# Patient Record
Sex: Male | Born: 1960 | Race: White | Hispanic: No | Marital: Married | Smoking: Current every day smoker
Health system: Southern US, Community
[De-identification: ages and names within clinical notes are randomized; demographics above are authoritative.]

## PROBLEM LIST (undated history)

## (undated) DIAGNOSIS — I10 Essential (primary) hypertension: Secondary | ICD-10-CM

## (undated) DIAGNOSIS — M5136 Other intervertebral disc degeneration, lumbar region: Secondary | ICD-10-CM

## (undated) DIAGNOSIS — I219 Acute myocardial infarction, unspecified: Secondary | ICD-10-CM

## (undated) DIAGNOSIS — M549 Dorsalgia, unspecified: Secondary | ICD-10-CM

## (undated) DIAGNOSIS — M51369 Other intervertebral disc degeneration, lumbar region without mention of lumbar back pain or lower extremity pain: Secondary | ICD-10-CM

## (undated) DIAGNOSIS — M5126 Other intervertebral disc displacement, lumbar region: Secondary | ICD-10-CM

## (undated) HISTORY — PX: ORCHIECTOMY: SHX2116

## (undated) HISTORY — PX: KNEE SURGERY: SHX244

## (undated) HISTORY — PX: CORONARY ANGIOPLASTY WITH STENT PLACEMENT: SHX49

## (undated) HISTORY — PX: ARM WOUND REPAIR / CLOSURE: SUR1141

---

## 2007-12-23 ENCOUNTER — Emergency Department (HOSPITAL_COMMUNITY): Admission: EM | Admit: 2007-12-23 | Discharge: 2007-12-24 | Payer: Self-pay | Admitting: Emergency Medicine

## 2008-02-07 ENCOUNTER — Emergency Department (HOSPITAL_COMMUNITY): Admission: EM | Admit: 2008-02-07 | Discharge: 2008-02-07 | Payer: Self-pay | Admitting: Family Medicine

## 2009-03-15 ENCOUNTER — Emergency Department (HOSPITAL_BASED_OUTPATIENT_CLINIC_OR_DEPARTMENT_OTHER): Admission: EM | Admit: 2009-03-15 | Discharge: 2009-03-15 | Payer: Self-pay | Admitting: Emergency Medicine

## 2010-01-13 ENCOUNTER — Emergency Department (HOSPITAL_COMMUNITY): Admission: EM | Admit: 2010-01-13 | Discharge: 2010-01-13 | Payer: Self-pay | Admitting: Emergency Medicine

## 2011-06-05 LAB — CBC
HCT: 46.9
Hemoglobin: 16.2
MCHC: 34.5
MCV: 86.6
Platelets: 286
RDW: 13.3

## 2011-06-05 LAB — DIFFERENTIAL
Basophils Absolute: 0
Basophils Relative: 0
Eosinophils Absolute: 0
Eosinophils Relative: 0
Monocytes Absolute: 1.4 — ABNORMAL HIGH

## 2011-12-22 ENCOUNTER — Encounter (HOSPITAL_BASED_OUTPATIENT_CLINIC_OR_DEPARTMENT_OTHER): Payer: Self-pay | Admitting: Emergency Medicine

## 2011-12-22 ENCOUNTER — Emergency Department (INDEPENDENT_AMBULATORY_CARE_PROVIDER_SITE_OTHER): Payer: Self-pay

## 2011-12-22 ENCOUNTER — Emergency Department (HOSPITAL_BASED_OUTPATIENT_CLINIC_OR_DEPARTMENT_OTHER)
Admission: EM | Admit: 2011-12-22 | Discharge: 2011-12-22 | Disposition: A | Discharge status: Home / Self Care | Payer: Self-pay | Attending: Emergency Medicine | Admitting: Emergency Medicine

## 2011-12-22 DIAGNOSIS — M545 Low back pain: Secondary | ICD-10-CM

## 2011-12-22 DIAGNOSIS — M549 Dorsalgia, unspecified: Secondary | ICD-10-CM | POA: Insufficient documentation

## 2011-12-22 DIAGNOSIS — M79609 Pain in unspecified limb: Secondary | ICD-10-CM

## 2011-12-22 DIAGNOSIS — F17200 Nicotine dependence, unspecified, uncomplicated: Secondary | ICD-10-CM | POA: Insufficient documentation

## 2011-12-22 DIAGNOSIS — X500XXA Overexertion from strenuous movement or load, initial encounter: Secondary | ICD-10-CM

## 2011-12-22 HISTORY — DX: Other intervertebral disc degeneration, lumbar region: M51.36

## 2011-12-22 HISTORY — DX: Other intervertebral disc displacement, lumbar region: M51.26

## 2011-12-22 HISTORY — DX: Other intervertebral disc degeneration, lumbar region without mention of lumbar back pain or lower extremity pain: M51.369

## 2011-12-22 HISTORY — DX: Dorsalgia, unspecified: M54.9

## 2011-12-22 MED ORDER — CYCLOBENZAPRINE HCL 5 MG PO TABS: 5.0000 mg | ORAL_TABLET | Freq: Two times a day (BID) | ORAL | Status: AC | PRN

## 2011-12-22 MED ORDER — OXYCODONE-ACETAMINOPHEN 5-325 MG PO TABS: 2.0000 | ORAL_TABLET | ORAL | Status: AC | PRN

## 2011-12-22 MED ORDER — CYCLOBENZAPRINE HCL 10 MG PO TABS
5.0000 mg | ORAL_TABLET | Freq: Once | ORAL | Status: AC
  Administered 2011-12-22: 5 mg via ORAL
  Filled 2011-12-22: qty 1

## 2011-12-22 MED ORDER — OXYCODONE-ACETAMINOPHEN 5-325 MG PO TABS
1.0000 | ORAL_TABLET | Freq: Once | ORAL | Status: AC
  Administered 2011-12-22: 1 via ORAL
  Filled 2011-12-22: qty 1

## 2011-12-22 MED ORDER — KETOROLAC TROMETHAMINE 60 MG/2ML IM SOLN
60.0000 mg | Freq: Once | INTRAMUSCULAR | Status: AC
  Administered 2011-12-22: 60 mg via INTRAMUSCULAR
  Filled 2011-12-22: qty 2

## 2011-12-22 NOTE — ED Provider Notes (Signed)
History/physical exam/procedure(s) were performed by non-physician practitioner and as supervising physician I was immediately available for consultation/collaboration. I have reviewed all notes and am in agreement with care and plan.   Shanee Batch S Izaya Netherton, MD 12/22/11 1455 

## 2011-12-22 NOTE — ED Notes (Signed)
Pt states he fell over a log yesterday.  C/o lower back pain radiating down right leg with some numbness and tingling.  Pt states he cannot lift right leg without severe pain.  Pt also c/o inability to stand on right leg.  No difficulty with elimination.

## 2011-12-22 NOTE — Discharge Instructions (Signed)
Back Exercises   Back exercises help treat and prevent back injuries. The goal of back exercises is to increase the strength of your abdominal and back muscles and the flexibility of your back. These exercises should be started when you no longer have back pain. Back exercises include:   Pelvic Tilt. Lie on your back with your knees bent. Tilt your pelvis until the lower part of your back is against the floor. Hold this position 5 to 10 sec and repeat 5 to 10 times.   Knee to Chest. Pull first 1 knee up against your chest and hold for 20 to 30 seconds, repeat this with the other knee, and then both knees. This may be done with the other leg straight or bent, whichever feels better.   Sit-Ups or Curl-Ups. Bend your knees 90 degrees. Start with tilting your pelvis, and do a partial, slow sit-up, lifting your trunk only 30 to 45 degrees off the floor. Take at least 2 to 3 seconds for each sit-up. Do not do sit-ups with your knees out straight. If partial sit-ups are difficult, simply do the above but with only tightening your abdominal muscles and holding it as directed.   Hip-Lift. Lie on your back with your knees flexed 90 degrees. Push down with your feet and shoulders as you raise your hips a couple inches off the floor; hold for 10 seconds, repeat 5 to 10 times.   Back arches. Lie on your stomach, propping yourself up on bent elbows. Slowly press on your hands, causing an arch in your low back. Repeat 3 to 5 times. Any initial stiffness and discomfort should lessen with repetition over time.   Shoulder-Lifts. Lie face down with arms beside your body. Keep hips and torso pressed to floor as you slowly lift your head and shoulders off the floor.   Do not overdo your exercises, especially in the beginning. Exercises may cause you some mild back discomfort which lasts for a few minutes; however, if the pain is more severe, or lasts for more than 15 minutes, do not continue exercises until you see your caregiver.  Improvement with exercise therapy for back problems is slow.   See your caregivers for assistance with developing a proper back exercise program.   Document Released: 10/03/2004 Document Revised: 08/15/2011 Document Reviewed: 08/26/2005   ExitCare® Patient Information ©2012 [employer].     Back Pain, Adult   Low back pain is very common. About 1 in 5 people have back pain. The cause of low back pain is rarely dangerous. The pain often gets better over time. About half of people with a sudden onset of back pain feel better in just 2 weeks. About 8 in 10 people feel better by 6 weeks.   CAUSES   Some common causes of back pain include:   Strain of the muscles or ligaments supporting the spine.   Wear and tear (degeneration) of the spinal discs.   Arthritis.   Direct injury to the back.   DIAGNOSIS   Most of the time, the direct cause of low back pain is not known. However, back pain can be treated effectively even when the exact cause of the pain is unknown. Answering your caregiver's questions about your overall health and symptoms is one of the most accurate ways to make sure the cause of your pain is not dangerous. If your caregiver needs more information, he or she may order lab work or imaging tests (X-rays or MRIs). However, even   if imaging tests show changes in your back, this usually does not require surgery.   HOME CARE INSTRUCTIONS   For many people, back pain returns. Since low back pain is rarely dangerous, it is often a condition that people can learn to manage on their own.   Remain active. It is stressful on the back to sit or stand in one place. Do not sit, drive, or stand in one place for more than 30 minutes at a time. Take short walks on level surfaces as soon as pain allows. Try to increase the length of time you walk each day.   Do not stay in bed. Resting more than 1 or 2 days can delay your recovery.   Do not avoid exercise or work. Your body is made to move. It is not dangerous to be active,  even though your back may hurt. Your back will likely heal faster if you return to being active before your pain is gone.   Pay attention to your body when you bend and lift. Many people have less discomfort when lifting if they bend their knees, keep the load close to their bodies, and avoid twisting. Often, the most comfortable positions are those that put less stress on your recovering back.   Find a comfortable position to sleep. Use a firm mattress and lie on your side with your knees slightly bent. If you lie on your back, put a pillow under your knees.   Only take over-the-counter or prescription medicines as directed by your caregiver. Over-the-counter medicines to reduce pain and inflammation are often the most helpful. Your caregiver may prescribe muscle relaxant drugs. These medicines help dull your pain so you can more quickly return to your normal activities and healthy exercise.   Put ice on the injured area.   Put ice in a plastic bag.   Place a towel between your skin and the bag.   Leave the ice on for 15 to 20 minutes, 3 to 4 times a day for the first 2 to 3 days. After that, ice and heat may be alternated to reduce pain and spasms.   Ask your caregiver about trying back exercises and gentle massage. This may be of some benefit.   Avoid feeling anxious or stressed. Stress increases muscle tension and can worsen back pain. It is important to recognize when you are anxious or stressed and learn ways to manage it. Exercise is a great option.   SEEK MEDICAL CARE IF:   You have pain that is not relieved with rest or medicine.   You have pain that does not improve in 1 week.   You have new symptoms.   You are generally not feeling well.   SEEK IMMEDIATE MEDICAL CARE IF:   You have pain that radiates from your back into your legs.   You develop new bowel or bladder control problems.   You have unusual weakness or numbness in your arms or legs.   You develop nausea or vomiting.   You develop abdominal  pain.   You feel faint.   Document Released: 08/26/2005 Document Revised: 08/15/2011 Document Reviewed: 01/14/2011   ExitCare® Patient Information ©2012 [employer].

## 2011-12-22 NOTE — ED Provider Notes (Signed)
History     CSN: [id number]  Arrival date & time 12/22/11  1147   First MD Initiated Contact with Patient 12/22/11 1218      Chief Complaint  Patient presents with  . Back Pain  . Back Injury    (Consider location/radiation/quality/duration/timing/severity/associated sxs/prior treatment) HPI Comments: Pt states that he tripped over some wood yesterday and no has bad lower back pain that radiates down his right leg:pt states that putting wt on the right causes pain:pt denies bowel or bladder incontinence;pt states that he has had back problems years ago  Patient is a 51 y.o. male presenting with back pain. The history is provided by the patient. No language interpreter was used.  Back Pain  This is a new problem. The current episode started yesterday. The problem occurs constantly. The problem has not changed since onset.The pain is associated with falling. The pain is present in the lumbar spine. The pain radiates to the right thigh. The pain is severe. The symptoms are aggravated by bending and twisting. The pain is the same all the time. Associated symptoms include leg pain and tingling. Pertinent negatives include no fever, no numbness, no bowel incontinence, no perianal numbness, no bladder incontinence and no weakness. He has tried NSAIDs for the symptoms. The treatment provided mild relief.    Past Medical History  Diagnosis Date  . Bulging lumbar disc   . Back pain     Past Surgical History  Procedure Date  . Knee surgery   . Arm wound repair / closure     History reviewed. No pertinent family history.  History  Substance Use Topics  . Smoking status: Current Everyday Smoker -- 1.0 packs/day    Types: Cigarettes  . Smokeless tobacco: Not on file  . Alcohol Use: No      Review of Systems  Constitutional: Negative for fever.  Respiratory: Negative.   Cardiovascular: Negative.   Gastrointestinal: Negative for bowel incontinence.  Genitourinary: Negative for  bladder incontinence.  Musculoskeletal: Positive for back pain.  Neurological: Positive for tingling. Negative for weakness and numbness.    Allergies  Penicillins  Home Medications  No current outpatient prescriptions on file.  BP 125/95  Pulse 104  Temp(Src) 98.3 F (36.8 C) (Oral)  Resp 16  Ht 5\' 3"  (1.6 m)  Wt 110 lb (49.896 kg)  BMI 19.49 kg/m2  SpO2 96%  Physical Exam  Nursing note and vitals reviewed. Constitutional: He is oriented to person, place, and time. He appears well-developed and well-nourished.  HENT:  Head: Normocephalic and atraumatic.  Eyes: Conjunctivae and EOM are normal.  Cardiovascular: Normal rate and regular rhythm.   Pulmonary/Chest: Effort normal and breath sounds normal.  Musculoskeletal: Normal range of motion.       Cervical back: Normal.       Thoracic back: Normal.       Lumbar back: He exhibits tenderness and bony tenderness. He exhibits normal range of motion, no swelling and no edema.  Neurological: He is alert and oriented to person, place, and time.  Skin: Skin is warm.  Psychiatric: He has a normal mood and affect.    ED Course  Procedures (including critical care time)  Labs Reviewed - No data to display Dg Lumbar Spine Complete  12/22/2011  *RADIOLOGY REPORT*  Clinical Data: Low back pain.  Injury while chopping wood.  The pain is worse on the right and extends into the right lower extremity.  LUMBAR SPINE - COMPLETE 4+ VIEW  Comparison:  None.  Findings: Five non-rib bearing lumbar type vertebral bodies are present.  The vertebral body heights and alignment are maintained. There is straightening of the normal lumbar lordosis.  Mild loss of disc height is present at L5-S1.  The disc levels are otherwise preserved.  IMPRESSION:  1.  No acute fracture or traumatic subluxation. 2.  Mild straightening of the normal lumbar lordosis.  This is nonspecific, but can be seen in the setting of acute muscle strain or ongoing pain.  Original  Report Authenticated By: Jamesetta Orleans. MATTERN, M.D.     1. Back pain       MDM  Pt is feeling better at this time:pt is able to do a straight leg raise and ambulated without any problem:will treat symptomatically and refer to ortho        Teressa Lower, NP 12/22/11 1418

## 2012-04-30 ENCOUNTER — Emergency Department (HOSPITAL_BASED_OUTPATIENT_CLINIC_OR_DEPARTMENT_OTHER): Payer: Medicaid Other

## 2012-04-30 ENCOUNTER — Encounter (HOSPITAL_BASED_OUTPATIENT_CLINIC_OR_DEPARTMENT_OTHER): Payer: Self-pay

## 2012-04-30 ENCOUNTER — Emergency Department (HOSPITAL_BASED_OUTPATIENT_CLINIC_OR_DEPARTMENT_OTHER)
Admission: EM | Admit: 2012-04-30 | Discharge: 2012-04-30 | Disposition: A | Discharge status: Home / Self Care | Payer: Medicaid Other | Attending: Emergency Medicine | Admitting: Emergency Medicine

## 2012-04-30 DIAGNOSIS — N509 Disorder of male genital organs, unspecified: Secondary | ICD-10-CM | POA: Insufficient documentation

## 2012-04-30 DIAGNOSIS — N50819 Testicular pain, unspecified: Secondary | ICD-10-CM

## 2012-04-30 DIAGNOSIS — F17200 Nicotine dependence, unspecified, uncomplicated: Secondary | ICD-10-CM | POA: Insufficient documentation

## 2012-04-30 LAB — URINALYSIS, ROUTINE W REFLEX MICROSCOPIC
Bilirubin Urine: NEGATIVE
Ketones, ur: NEGATIVE mg/dL
Leukocytes, UA: NEGATIVE
Nitrite: NEGATIVE
Protein, ur: NEGATIVE mg/dL
Urobilinogen, UA: 0.2 mg/dL (ref 0.0–1.0)
pH: 7 (ref 5.0–8.0)

## 2012-04-30 MED ORDER — OXYCODONE-ACETAMINOPHEN 5-325 MG PO TABS
2.0000 | ORAL_TABLET | Freq: Once | ORAL | Status: AC
  Administered 2012-04-30: 2 via ORAL
  Filled 2012-04-30 (×2): qty 2

## 2012-04-30 MED ORDER — OXYCODONE-ACETAMINOPHEN 5-325 MG PO TABS: 2.0000 | ORAL_TABLET | ORAL | Status: AC | PRN

## 2012-04-30 NOTE — ED Provider Notes (Signed)
Medical screening examination/treatment/procedure(s) were performed by non-physician practitioner and as supervising physician I was immediately available for consultation/collaboration.   Mekhi Lascola B. Bernette Mayers, MD 04/30/12 1506

## 2012-04-30 NOTE — ED Notes (Addendum)
C.o right testicular pain and "hard area" x 1 year-worse x 3 months-pt reports he was seen at [facility] last week-Urology appt 9/4-was given pain med and abx x 2

## 2012-04-30 NOTE — ED Provider Notes (Signed)
Medical screening examination/treatment/procedure(s) were performed by non-physician practitioner and as supervising physician I was immediately available for consultation/collaboration.   Ylonda Storr B. Bernette Mayers, MD 04/30/12 1513

## 2012-04-30 NOTE — ED Provider Notes (Addendum)
History     CSN: [id number]  Arrival date & time 04/30/12  1140   None     No chief complaint on file.   (Consider location/radiation/quality/duration/timing/severity/associated sxs/prior treatment) Patient is a 51 y.o. male presenting with testicular pain. The history is provided by the patient. No language interpreter was used.  Testicle Pain This is a chronic problem. The current episode started more than 1 year ago. The problem occurs constantly. The problem has been gradually worsening. Associated symptoms comments: Pain right testicle. Nothing aggravates the symptoms. He has tried nothing for the symptoms. The treatment provided moderate relief.  Pt complains of a mass to right testicle for several months,   Pt reports he has had pain for the last year  Past Medical History  Diagnosis Date  . Bulging lumbar disc   . Back pain     Past Surgical History  Procedure Date  . Knee surgery   . Arm wound repair / closure     No family history on file.  History  Substance Use Topics  . Smoking status: Current Everyday Smoker -- 1.0 packs/day    Types: Cigarettes  . Smokeless tobacco: Not on file  . Alcohol Use: No      Review of Systems  Genitourinary: Positive for testicular pain.  All other systems reviewed and are negative.    Allergies  Penicillins  Home Medications  No current outpatient prescriptions on file.  BP 148/75  Pulse 76  Temp 97.9 F (36.6 C) (Oral)  Resp 18  Ht 5\' 3"  (1.6 m)  Wt 103 lb (46.72 kg)  BMI 18.25 kg/m2  SpO2 99%  Physical Exam  Nursing note and vitals reviewed. Constitutional: He is oriented to person, place, and time.  HENT:  Head: Normocephalic and atraumatic.  Right Ear: External ear normal.  Eyes: Pupils are equal, round, and reactive to light.  Neck: Normal range of motion. Neck supple.  Cardiovascular: Normal rate and normal heart sounds.   Pulmonary/Chest: Effort normal.  Abdominal: Soft.  Genitourinary:   Swollen tender area right   Musculoskeletal: Normal range of motion.  Neurological: He is alert and oriented to person, place, and time. He has normal reflexes.  Skin: Skin is warm.  Psychiatric: He has a normal mood and affect.    ED Course  Procedures (including critical care time)  Labs Reviewed - No data to display No results found.   1. Testicle pain       MDM  Pt counseled on results,  Pt given percocet,  I advised continue doxycycline.  I spoke to [facility] NP and advised of results and need for follow up        Elson Areas, [place] 04/30/12 [address] [place], [place] 04/30/12 [phone]

## 2012-05-26 ENCOUNTER — Emergency Department (HOSPITAL_BASED_OUTPATIENT_CLINIC_OR_DEPARTMENT_OTHER)
Admission: EM | Admit: 2012-05-26 | Discharge: 2012-05-26 | Disposition: A | Discharge status: Home / Self Care | Payer: Medicaid Other | Attending: Emergency Medicine | Admitting: Emergency Medicine

## 2012-05-26 ENCOUNTER — Encounter (HOSPITAL_BASED_OUTPATIENT_CLINIC_OR_DEPARTMENT_OTHER): Payer: Self-pay | Admitting: *Deleted

## 2012-05-26 DIAGNOSIS — F17200 Nicotine dependence, unspecified, uncomplicated: Secondary | ICD-10-CM | POA: Insufficient documentation

## 2012-05-26 DIAGNOSIS — N509 Disorder of male genital organs, unspecified: Secondary | ICD-10-CM | POA: Insufficient documentation

## 2012-05-26 DIAGNOSIS — N50819 Testicular pain, unspecified: Secondary | ICD-10-CM

## 2012-05-26 LAB — URINALYSIS, ROUTINE W REFLEX MICROSCOPIC
Bilirubin Urine: NEGATIVE
Hgb urine dipstick: NEGATIVE
Ketones, ur: NEGATIVE mg/dL
Specific Gravity, Urine: 1.004 — ABNORMAL LOW (ref 1.005–1.030)
Urobilinogen, UA: 0.2 mg/dL (ref 0.0–1.0)

## 2012-05-26 MED ORDER — OXYCODONE-ACETAMINOPHEN 5-325 MG PO TABS
1.0000 | ORAL_TABLET | Freq: Once | ORAL | Status: AC
  Administered 2012-05-26: 1 via ORAL
  Filled 2012-05-26 (×2): qty 1

## 2012-05-26 MED ORDER — NAPROXEN 500 MG PO TABS: 500.0000 mg | ORAL_TABLET | Freq: Two times a day (BID) | ORAL | Status: DC

## 2012-05-26 MED ORDER — OXYCODONE-ACETAMINOPHEN 5-325 MG PO TABS: 1.0000 | ORAL_TABLET | ORAL | Status: DC | PRN

## 2012-05-26 NOTE — ED Provider Notes (Signed)
History     CSN: [id number]  Arrival date & time 05/26/12  1639   First MD Initiated Contact with Patient 05/26/12 1654      Chief Complaint  Patient presents with  . Groin Pain     Patient is a 51 y.o. male presenting with groin pain. The history is provided by the patient.  Groin Pain The current episode started more than 1 week ago (Patient has been having these symptoms since prior to August 22). The problem occurs constantly. The problem has not changed since onset.Pertinent negatives include no chest pain, no abdominal pain, no headaches and no shortness of breath. Exacerbated by: Palpation and walking. Treatments tried: The patient was seen in the emergency room on August 22. 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 Patient was seen by urologist as well.   they tried him on a course of doxycycline as well as Cipro and azithromycin. His symptoms have not gotten much better. The patient has plans to see the urologist tomorrow but when he tried to call today to get some pain medications he was benign. .the symptoms have persisted. He denies any fever, vomiting or diarrhea. He denies any abdominal pain. He denies any penile discharge or hematuria  Past Medical History  Diagnosis Date  . Bulging lumbar disc   . Back pain     Past Surgical History  Procedure Date  . Knee surgery   . Arm wound repair / closure     No family history on file.  History  Substance Use Topics  . Smoking status: Current Every Day Smoker -- 1.0 packs/day    Types: Cigarettes  . Smokeless tobacco: Not on file  . Alcohol Use: No      Review of Systems  Respiratory: Negative for shortness of breath.   Cardiovascular: Negative for chest pain.  Gastrointestinal: Negative for abdominal pain.  Neurological: Negative for headaches.  All other systems reviewed and are negative.    Allergies  Penicillins  Home Medications   Current Outpatient  Rx  Name Route Sig Dispense Refill  . AZITHROMYCIN 250 MG PO TABS Oral Take 250 mg by mouth daily.    Marland Kitchen CIPROFLOXACIN HCL 250 MG PO TABS Oral Take 250 mg by mouth 2 (two) times daily.    Marland Kitchen DOXYCYCLINE HYCLATE 100 MG PO TBEC Oral Take 100 mg by mouth 2 (two) times daily.    . OXYCODONE-ACETAMINOPHEN 5-325 MG PO TABS Oral Take 1 tablet by mouth every 4 (four) hours as needed. For pain.    Marland Kitchen TRAMADOL HCL 50 MG PO TABS Oral Take 50 mg by mouth every 6 (six) hours as needed. For pain.      BP 153/128  Pulse 99  Temp 99 F (37.2 C) (Oral)  Resp 24  SpO2 100%  Physical Exam  Nursing note and vitals reviewed. Constitutional: He appears well-developed and well-nourished. No distress.  HENT:  Head: Normocephalic and atraumatic.  Right Ear: External ear normal.  Left Ear: External ear normal.  Eyes: Conjunctivae normal are normal. Right eye exhibits no discharge. Left eye exhibits no discharge. No scleral icterus.  Neck: Neck supple. No tracheal deviation present.  Cardiovascular: Normal rate, regular rhythm and intact distal pulses.   Pulmonary/Chest: Effort normal and breath sounds normal. No stridor. No respiratory distress. He has no wheezes. He has no rales.  Abdominal: Soft. Bowel sounds are normal. He exhibits no distension. There is no tenderness. There is no rebound  and no guarding. Hernia confirmed negative in the right inguinal area and confirmed negative in the left inguinal area.  Genitourinary: Penis normal. Right testis shows tenderness. Right testis shows no mass and no swelling. Right testis is descended. Cremasteric reflex is not absent on the right side. Left testis shows no mass, no swelling and no tenderness. Left testis is descended. Cremasteric reflex is not absent on the left side. No penile erythema or penile tenderness. No discharge found.  Musculoskeletal: He exhibits no edema and no tenderness.  Lymphadenopathy:       Right: No inguinal adenopathy present.       Left:  No inguinal adenopathy present.  Neurological: He is alert. He has normal strength. No sensory deficit. Cranial nerve deficit:  no gross defecits noted. He exhibits normal muscle tone. He displays no seizure activity. Coordination normal.  Skin: Skin is warm and dry. No rash noted.  Psychiatric: He has a normal mood and affect.    ED Course  Procedures (including critical care time)  Labs Reviewed  URINALYSIS, ROUTINE W REFLEX MICROSCOPIC - Abnormal; Notable for the following:    Specific Gravity, Urine 1.004 (*)     All other components within normal limits   No results found.   1. Testicular pain       MDM  PT had an outpatient [place] when these symptoms started.  No sign of mass or torsion.  Pt has follow up with urology tomorrow.  No sign of uti.  Already on abx for epididymitis.  At this time there does not appear to be any evidence of an acute emergency medical condition and the patient appears stable for discharge with appropriate outpatient follow up.         Celene Kras, MD 05/26/12 [phone]

## 2012-05-26 NOTE — ED Notes (Signed)
Pain in his right groin. Was treated for same 8/22 with a z-pak. He saw a urologist at that time but has seen no improvement with pain.

## 2012-09-25 ENCOUNTER — Encounter (HOSPITAL_BASED_OUTPATIENT_CLINIC_OR_DEPARTMENT_OTHER): Payer: Self-pay | Admitting: Emergency Medicine

## 2012-09-25 ENCOUNTER — Emergency Department (HOSPITAL_BASED_OUTPATIENT_CLINIC_OR_DEPARTMENT_OTHER): Payer: Medicaid Other

## 2012-09-25 ENCOUNTER — Emergency Department (HOSPITAL_BASED_OUTPATIENT_CLINIC_OR_DEPARTMENT_OTHER)
Admission: EM | Admit: 2012-09-25 | Discharge: 2012-09-25 | Disposition: A | Discharge status: Home / Self Care | Payer: Medicaid Other | Attending: Emergency Medicine | Admitting: Emergency Medicine

## 2012-09-25 DIAGNOSIS — X500XXA Overexertion from strenuous movement or load, initial encounter: Secondary | ICD-10-CM | POA: Insufficient documentation

## 2012-09-25 DIAGNOSIS — Y9389 Activity, other specified: Secondary | ICD-10-CM | POA: Insufficient documentation

## 2012-09-25 DIAGNOSIS — F17200 Nicotine dependence, unspecified, uncomplicated: Secondary | ICD-10-CM | POA: Insufficient documentation

## 2012-09-25 DIAGNOSIS — S99929A Unspecified injury of unspecified foot, initial encounter: Secondary | ICD-10-CM | POA: Insufficient documentation

## 2012-09-25 DIAGNOSIS — Y929 Unspecified place or not applicable: Secondary | ICD-10-CM | POA: Insufficient documentation

## 2012-09-25 DIAGNOSIS — M25569 Pain in unspecified knee: Secondary | ICD-10-CM

## 2012-09-25 DIAGNOSIS — S8990XA Unspecified injury of unspecified lower leg, initial encounter: Secondary | ICD-10-CM | POA: Insufficient documentation

## 2012-09-25 DIAGNOSIS — Z8739 Personal history of other diseases of the musculoskeletal system and connective tissue: Secondary | ICD-10-CM | POA: Insufficient documentation

## 2012-09-25 MED ORDER — OXYCODONE-ACETAMINOPHEN 5-325 MG PO TABS: 1.0000 | ORAL_TABLET | Freq: Four times a day (QID) | ORAL | Status: DC | PRN

## 2012-09-25 MED ORDER — NAPROXEN 500 MG PO TABS: 500.0000 mg | ORAL_TABLET | Freq: Two times a day (BID) | ORAL | Status: DC

## 2012-09-25 NOTE — ED Notes (Signed)
Patient report he stepped off a ladder at 11:30 and felt something "pop on the right side of my right knee." No swelling or redness on assessment. CMS intact.

## 2012-09-25 NOTE — ED Provider Notes (Signed)
History     CSN: [id number]  Arrival date & time 09/25/12  1634   First MD Initiated Contact with Patient 09/25/12 1714      Chief Complaint  Patient presents with  . Knee Injury    (Consider location/radiation/quality/duration/timing/severity/associated sxs/prior treatment) HPI Comments: Patient presents with complaint of right knee pain that started acutely today after twisting his knee while stepping off the first rung of a ladder. Patient denies other injuries. Patient states that he felt a pop, similar to when he tore his anterior cruciate ligament in the past. Patient complains of knee pain that is worse with walking, however he is ambulatory. Patient is been taking ibuprofen without relief. He denies numbness, tingling, or weakness of the lower extremity. Patient has an orthopedic doctor in [place]. No back pain. Course is constant.   The history is provided by the patient.    Past Medical History  Diagnosis Date  . Bulging lumbar disc   . Back pain     Past Surgical History  Procedure Date  . Knee surgery   . Arm wound repair / closure     History reviewed. No pertinent family history.  History  Substance Use Topics  . Smoking status: Current Every Day Smoker -- 1.0 packs/day    Types: Cigarettes  . Smokeless tobacco: Not on file  . Alcohol Use: No      Review of Systems  Constitutional: Negative for activity change.  HENT: Negative for neck pain.   Musculoskeletal: Positive for joint swelling, arthralgias and gait problem. Negative for back pain.  Skin: Negative for wound.  Neurological: Negative for weakness and numbness.    Allergies  Penicillins  Home Medications   Current Outpatient Rx  Name  Route  Sig  Dispense  Refill  . MELOXICAM 15 MG PO TABS   Oral   Take 15 mg by mouth daily.         . AZITHROMYCIN 250 MG PO TABS   Oral   Take 250 mg by mouth daily.         Marland Kitchen CIPROFLOXACIN HCL 250 MG PO TABS   Oral   Take 250 mg by mouth  2 (two) times daily.         Marland Kitchen DOXYCYCLINE HYCLATE 100 MG PO TBEC   Oral   Take 100 mg by mouth 2 (two) times daily.         Marland Kitchen NAPROXEN 500 MG PO TABS   Oral   Take 1 tablet (500 mg total) by mouth 2 (two) times daily with a meal. As needed for pain   20 tablet   0   . OXYCODONE-ACETAMINOPHEN 5-325 MG PO TABS   Oral   Take 1 tablet by mouth every 4 (four) hours as needed. For pain.   16 tablet   0   . TRAMADOL HCL 50 MG PO TABS   Oral   Take 50 mg by mouth every 6 (six) hours as needed. For pain.           BP 160/95  Pulse 84  Temp 98 F (36.7 C) (Oral)  Resp 16  SpO2 98%  Physical Exam  Nursing note and vitals reviewed. Constitutional: He appears well-developed and well-nourished.  HENT:  Head: Normocephalic and atraumatic.  Eyes: Conjunctivae normal are normal.  Neck: Normal range of motion. Neck supple.  Cardiovascular: Normal pulses.   Pulses:      Dorsalis pedis pulses are 2+ on the right side, and 2+  on the left side.       Posterior tibial pulses are 2+ on the right side, and 2+ on the left side.  Musculoskeletal: He exhibits edema and tenderness.       Right hip: Normal.       Right knee: He exhibits effusion (mild). He exhibits normal range of motion, no ecchymosis, no deformity, no laceration and no erythema. tenderness found. Medial joint line and lateral joint line tenderness noted. No patellar tendon tenderness noted.       Right ankle: Normal. no tenderness. Achilles tendon normal.  Neurological: He is alert. No sensory deficit.       Motor, sensation, and vascular distal to the injury is fully intact.   Skin: Skin is warm and dry.  Psychiatric: He has a normal mood and affect.    ED Course  Procedures (including critical care time)  Labs Reviewed - No data to display Dg Knee Complete 4 Views Right  09/25/2012  *RADIOLOGY REPORT*  Clinical Data: Knee injury  RIGHT KNEE - COMPLETE 4+ VIEW  Comparison: None.  Findings: Four views of the right  knee submitted.  No acute fracture or subluxation.  Prior surgical changes noted distal femur and proximal tibia.  There is mild spurring of the patella.  Small joint effusion.  IMPRESSION: No acute fracture or subluxation.  Mild spurring of patella.  Small joint effusion.   Original Report Authenticated By: Natasha Mead, M.D.      1. Knee pain     6:00 PM Patient seen and examined. X-ray reviewed. Patient informed of results.   Vital signs reviewed and are as follows: Filed Vitals:   09/25/12 1641  BP: 160/95  Pulse: 84  Temp: 98 F (36.7 C)  Resp: 16   Patient was counseled on RICE protocol and told to rest injury, use ice for no longer than 15 minutes every hour, compress the area, and elevate above the level of their heart as much as possible to reduce swelling.  Questions answered.  Patient verbalized understanding.    Patient provided with knee immobilizer. He has crutches at home which he will use. Patient has an orthopedic doc in [place] who he will see for followup.  Patient counseled on use of narcotic pain medications. Counseled not to combine these medications with others containing tylenol. Urged not to drink alcohol, drive, or perform any other activities that requires focus while taking these medications. The patient verbalizes understanding and agrees with the plan.   MDM  Knee injury -- negative x-ray. Concern for ligamentous injury. Knee immobilizer, crutches with orthopedic followup indicated. Patient given his medication for pain control.        Renne Crigler, [place] 09/25/12 [phone]

## 2012-09-25 NOTE — ED Provider Notes (Signed)
Medical screening examination/treatment/procedure(s) were performed by non-physician practitioner and as supervising physician I was immediately available for consultation/collaboration.  Christopher J. Pollina, MD 09/25/12 1820 

## 2013-11-24 ENCOUNTER — Inpatient Hospital Stay (HOSPITAL_BASED_OUTPATIENT_CLINIC_OR_DEPARTMENT_OTHER)
Admission: EM | Admit: 2013-11-24 | Discharge: 2013-11-27 | DRG: 392 | Disposition: A | Discharge status: Home / Self Care | Payer: Medicaid Other | Attending: Surgery | Admitting: Surgery

## 2013-11-24 ENCOUNTER — Emergency Department (HOSPITAL_BASED_OUTPATIENT_CLINIC_OR_DEPARTMENT_OTHER): Payer: Medicaid Other

## 2013-11-24 ENCOUNTER — Encounter (HOSPITAL_BASED_OUTPATIENT_CLINIC_OR_DEPARTMENT_OTHER): Payer: Self-pay | Admitting: Emergency Medicine

## 2013-11-24 DIAGNOSIS — F17200 Nicotine dependence, unspecified, uncomplicated: Secondary | ICD-10-CM | POA: Diagnosis present

## 2013-11-24 DIAGNOSIS — Z01810 Encounter for preprocedural cardiovascular examination: Secondary | ICD-10-CM

## 2013-11-24 DIAGNOSIS — M519 Unspecified thoracic, thoracolumbar and lumbosacral intervertebral disc disorder: Secondary | ICD-10-CM | POA: Diagnosis present

## 2013-11-24 DIAGNOSIS — F1210 Cannabis abuse, uncomplicated: Secondary | ICD-10-CM | POA: Diagnosis present

## 2013-11-24 DIAGNOSIS — R1031 Right lower quadrant pain: Principal | ICD-10-CM | POA: Diagnosis present

## 2013-11-24 DIAGNOSIS — Z7982 Long term (current) use of aspirin: Secondary | ICD-10-CM

## 2013-11-24 DIAGNOSIS — IMO0002 Reserved for concepts with insufficient information to code with codable children: Secondary | ICD-10-CM

## 2013-11-24 DIAGNOSIS — G8929 Other chronic pain: Secondary | ICD-10-CM | POA: Diagnosis present

## 2013-11-24 DIAGNOSIS — K37 Unspecified appendicitis: Secondary | ICD-10-CM | POA: Diagnosis present

## 2013-11-24 DIAGNOSIS — Z79899 Other long term (current) drug therapy: Secondary | ICD-10-CM

## 2013-11-24 DIAGNOSIS — Z9861 Coronary angioplasty status: Secondary | ICD-10-CM

## 2013-11-24 DIAGNOSIS — Z8249 Family history of ischemic heart disease and other diseases of the circulatory system: Secondary | ICD-10-CM

## 2013-11-24 DIAGNOSIS — I252 Old myocardial infarction: Secondary | ICD-10-CM

## 2013-11-24 DIAGNOSIS — I2510 Atherosclerotic heart disease of native coronary artery without angina pectoris: Secondary | ICD-10-CM | POA: Diagnosis present

## 2013-11-24 DIAGNOSIS — Z9079 Acquired absence of other genital organ(s): Secondary | ICD-10-CM

## 2013-11-24 DIAGNOSIS — I10 Essential (primary) hypertension: Secondary | ICD-10-CM | POA: Diagnosis present

## 2013-11-24 DIAGNOSIS — K219 Gastro-esophageal reflux disease without esophagitis: Secondary | ICD-10-CM | POA: Diagnosis present

## 2013-11-24 DIAGNOSIS — Z823 Family history of stroke: Secondary | ICD-10-CM

## 2013-11-24 HISTORY — DX: Acute myocardial infarction, unspecified: I21.9

## 2013-11-24 HISTORY — DX: Essential (primary) hypertension: I10

## 2013-11-24 LAB — CBC WITH DIFFERENTIAL/PLATELET
Basophils Absolute: 0.1 10*3/uL (ref 0.0–0.1)
Basophils Relative: 1 % (ref 0–1)
EOS ABS: 0.3 10*3/uL (ref 0.0–0.7)
Eosinophils Relative: 3 % (ref 0–5)
HEMATOCRIT: 39.9 % (ref 39.0–52.0)
Hemoglobin: 13.8 g/dL (ref 13.0–17.0)
LYMPHS PCT: 29 % (ref 12–46)
Lymphs Abs: 2.3 10*3/uL (ref 0.7–4.0)
MCH: 31.2 pg (ref 26.0–34.0)
MCHC: 34.6 g/dL (ref 30.0–36.0)
MCV: 90.3 fL (ref 78.0–100.0)
MONO ABS: 1 10*3/uL (ref 0.1–1.0)
Monocytes Relative: 13 % — ABNORMAL HIGH (ref 3–12)
Neutro Abs: 4.3 10*3/uL (ref 1.7–7.7)
Neutrophils Relative %: 54 % (ref 43–77)
Platelets: 243 10*3/uL (ref 150–400)
RBC: 4.42 MIL/uL (ref 4.22–5.81)
RDW: 13.3 % (ref 11.5–15.5)
WBC: 8 10*3/uL (ref 4.0–10.5)

## 2013-11-24 LAB — COMPREHENSIVE METABOLIC PANEL
ALT: 16 U/L (ref 0–53)
AST: 16 U/L (ref 0–37)
Albumin: 4.2 g/dL (ref 3.5–5.2)
Alkaline Phosphatase: 78 U/L (ref 39–117)
BUN: 16 mg/dL (ref 6–23)
CALCIUM: 9.7 mg/dL (ref 8.4–10.5)
CO2: 23 meq/L (ref 19–32)
Chloride: 99 mEq/L (ref 96–112)
Creatinine, Ser: 0.8 mg/dL (ref 0.50–1.35)
GFR calc non Af Amer: >90 mL/min (ref >90)
GLUCOSE: 99 mg/dL (ref 70–99)
Potassium: 4.5 mEq/L (ref 3.7–5.3)
Sodium: 137 mEq/L (ref 137–147)
TOTAL PROTEIN: 7.7 g/dL (ref 6.0–8.3)
Total Bilirubin: 0.2 mg/dL — ABNORMAL LOW (ref 0.3–1.2)

## 2013-11-24 LAB — URINALYSIS, ROUTINE W REFLEX MICROSCOPIC
Bilirubin Urine: NEGATIVE
Glucose, UA: NEGATIVE mg/dL
Hgb urine dipstick: NEGATIVE
KETONES UR: NEGATIVE mg/dL
Leukocytes, UA: NEGATIVE
NITRITE: NEGATIVE
PH: 6.5 (ref 5.0–8.0)
Protein, ur: NEGATIVE mg/dL
SPECIFIC GRAVITY, URINE: 1.008 (ref 1.005–1.030)
UROBILINOGEN UA: 0.2 mg/dL (ref 0.0–1.0)

## 2013-11-24 MED ORDER — ACETAMINOPHEN 650 MG RE SUPP: 650.0000 mg | Freq: Four times a day (QID) | RECTAL | Status: DC | PRN

## 2013-11-24 MED ORDER — SODIUM CHLORIDE 0.9 % IV SOLN
1.0000 g | Freq: Once | INTRAVENOUS | Status: AC
  Administered 2013-11-24: 1 g via INTRAVENOUS
  Filled 2013-11-24: qty 1

## 2013-11-24 MED ORDER — DIPHENHYDRAMINE HCL 50 MG/ML IJ SOLN
12.5000 mg | Freq: Four times a day (QID) | INTRAMUSCULAR | Status: DC | PRN
Start: 2013-11-24 — End: 2013-11-27

## 2013-11-24 MED ORDER — AMLODIPINE BESYLATE 5 MG PO TABS
5.0000 mg | ORAL_TABLET | Freq: Every day | ORAL | Status: DC
  Administered 2013-11-25 – 2013-11-26 (×2): 5 mg via ORAL
  Filled 2013-11-24 (×3): qty 1

## 2013-11-24 MED ORDER — MORPHINE SULFATE 2 MG/ML IJ SOLN
1.0000 mg | INTRAMUSCULAR | Status: DC | PRN
Start: 2013-11-24 — End: 2013-11-27
  Administered 2013-11-24 – 2013-11-25 (×6): 2 mg via INTRAVENOUS
  Administered 2013-11-25: 3 mg via INTRAVENOUS
  Administered 2013-11-25 – 2013-11-26 (×4): 2 mg via INTRAVENOUS
  Administered 2013-11-26: 3 mg via INTRAVENOUS
  Administered 2013-11-26 – 2013-11-27 (×3): 2 mg via INTRAVENOUS
  Filled 2013-11-24 (×3): qty 1
  Filled 2013-11-24: qty 2
  Filled 2013-11-24 (×10): qty 1
  Filled 2013-11-24: qty 2

## 2013-11-24 MED ORDER — OXYCODONE HCL 5 MG PO TABS
5.0000 mg | ORAL_TABLET | ORAL | Status: DC | PRN
  Administered 2013-11-25 – 2013-11-27 (×9): 10 mg via ORAL
  Filled 2013-11-24 (×9): qty 2

## 2013-11-24 MED ORDER — ACETAMINOPHEN 325 MG PO TABS: 650.0000 mg | ORAL_TABLET | Freq: Four times a day (QID) | ORAL | Status: DC | PRN

## 2013-11-24 MED ORDER — CARVEDILOL 25 MG PO TABS: 25.0000 mg | ORAL_TABLET | Freq: Two times a day (BID) | ORAL | Status: DC

## 2013-11-24 MED ORDER — CARVEDILOL 25 MG PO TABS
25.0000 mg | ORAL_TABLET | Freq: Two times a day (BID) | ORAL | Status: DC
  Administered 2013-11-24 – 2013-11-27 (×6): 25 mg via ORAL
  Filled 2013-11-24 (×8): qty 1

## 2013-11-24 MED ORDER — IOHEXOL 300 MG/ML  SOLN
50.0000 mL | Freq: Once | INTRAMUSCULAR | Status: AC | PRN
  Administered 2013-11-24: 50 mL via ORAL

## 2013-11-24 MED ORDER — NICOTINE 21 MG/24HR TD PT24
21.0000 mg | MEDICATED_PATCH | Freq: Once | TRANSDERMAL | Status: AC
  Administered 2013-11-24: 21 mg via TRANSDERMAL
  Filled 2013-11-24: qty 1

## 2013-11-24 MED ORDER — IOHEXOL 300 MG/ML  SOLN
100.0000 mL | Freq: Once | INTRAMUSCULAR | Status: AC | PRN
  Administered 2013-11-24: 100 mL via INTRAVENOUS

## 2013-11-24 MED ORDER — SODIUM CHLORIDE 0.9 % IV SOLN
1.0000 g | INTRAVENOUS | Status: DC
  Administered 2013-11-25 – 2013-11-26 (×2): 1 g via INTRAVENOUS
  Filled 2013-11-24 (×3): qty 1

## 2013-11-24 MED ORDER — ONDANSETRON HCL 4 MG/2ML IJ SOLN
4.0000 mg | Freq: Once | INTRAMUSCULAR | Status: AC
  Administered 2013-11-24: 4 mg via INTRAVENOUS
  Filled 2013-11-24: qty 2

## 2013-11-24 MED ORDER — LISINOPRIL 20 MG PO TABS
20.0000 mg | ORAL_TABLET | Freq: Two times a day (BID) | ORAL | Status: DC
  Administered 2013-11-24 – 2013-11-26 (×5): 20 mg via ORAL
  Filled 2013-11-24 (×8): qty 1

## 2013-11-24 MED ORDER — ONDANSETRON HCL 4 MG/2ML IJ SOLN: 4.0000 mg | Freq: Four times a day (QID) | INTRAMUSCULAR | Status: DC | PRN

## 2013-11-24 MED ORDER — POTASSIUM CHLORIDE IN NACL 20-0.9 MEQ/L-% IV SOLN
INTRAVENOUS | Status: DC
  Administered 2013-11-24 – 2013-11-26 (×5): via INTRAVENOUS
  Filled 2013-11-24 (×8): qty 1000

## 2013-11-24 MED ORDER — MORPHINE SULFATE 4 MG/ML IJ SOLN
4.0000 mg | Freq: Once | INTRAMUSCULAR | Status: AC
  Administered 2013-11-24: 4 mg via INTRAVENOUS
  Filled 2013-11-24: qty 1

## 2013-11-24 MED ORDER — DIPHENHYDRAMINE HCL 12.5 MG/5ML PO ELIX: 12.5000 mg | ORAL_SOLUTION | Freq: Four times a day (QID) | ORAL | Status: DC | PRN

## 2013-11-24 MED ORDER — PANTOPRAZOLE SODIUM 40 MG IV SOLR
40.0000 mg | Freq: Every day | INTRAVENOUS | Status: DC
  Administered 2013-11-24 – 2013-11-25 (×2): 40 mg via INTRAVENOUS
  Filled 2013-11-24 (×3): qty 40

## 2013-11-24 NOTE — Progress Notes (Signed)
Pharmacy - Brief note  Order was received to start heparin per pharmacy for periop-anticoag while off Effient(5 stents placed July 1015). It is unusual to use heparin in this situation so I spoke with Will Marlyne BeardsJennings, the patient needs an appendectomy and Cardiology will be consulting. The last dose of Effient was taken 3/18 AM so he is covered until 3/19 AM. He gave a verbal order to hold off on starting heparin until 3/19 AM or until Cardiology can consult.  Charolotte Ekeom Tauna Macfarlane, PharmD, pager [phone]. 11/24/2013,5:44 PM.

## 2013-11-24 NOTE — ED Provider Notes (Signed)
CSN: [id number]     Arrival date & time 11/24/13  [id number] History   First MD Initiated Contact with Patient 11/24/13 [phone]     Chief Complaint  Patient presents with  . Groin Pain     (Consider location/radiation/quality/duration/timing/severity/associated sxs/prior Treatment) HPI  This is a 53 year old male who presents with right lower quadrant pain. Patient reports onset of symptoms 2 days ago. He reports sharp nonradiating right lower quadrant pain. Currently it is 8/10. It is constant. Patient denies any nausea or vomiting. He reports decreased by mouth intake. Nothing makes the pain better or worse.  Patient denies any fever. Patient reports history of orchiectomy on the right approximately 2 years ago. Patient denies any additional symptoms including chest pain, shortness of breath, constipation, urinary symptoms, urethral discharge.  Past Medical History  Diagnosis Date  . Bulging lumbar disc   . Back pain   . MI (myocardial infarction)   . Hypertension    Past Surgical History  Procedure Laterality Date  . Knee surgery    . Arm wound repair / closure    . Coronary angioplasty with stent placement     No family history on file. History  Substance Use Topics  . Smoking status: Current Every Day Smoker -- 1.00 packs/day    Types: Cigarettes  . Smokeless tobacco: Not on file  . Alcohol Use: No    Review of Systems  Constitutional: Negative.  Negative for fever.  Respiratory: Negative.  Negative for chest tightness and shortness of breath.   Cardiovascular: Negative.  Negative for chest pain.  Gastrointestinal: Positive for abdominal pain. Negative for nausea, vomiting, diarrhea and constipation.  Genitourinary: Negative.  Negative for dysuria, hematuria, discharge, scrotal swelling, penile pain and testicular pain.  Musculoskeletal: Negative for back pain.  Skin: Negative for rash.  Neurological: Negative for headaches.  All other systems reviewed and are  negative.      Allergies  Penicillins  Home Medications   Current Outpatient Rx  Name  Route  Sig  Dispense  Refill  . amLODipine (NORVASC) 5 MG tablet   Oral   Take 5 mg by mouth daily.         Marland Kitchen. aspirin 81 MG tablet   Oral   Take 81 mg by mouth daily.         Marland Kitchen. atorvastatin (LIPITOR) 40 MG tablet   Oral   Take 40 mg by mouth daily.         . carvedilol (COREG) 25 MG tablet   Oral   Take 25 mg by mouth 2 (two) times daily with a meal.         . lisinopril (PRINIVIL,ZESTRIL) 20 MG tablet   Oral   Take 20 mg by mouth 2 (two) times daily.         . pantoprazole (PROTONIX) 20 MG tablet   Oral   Take 20 mg by mouth daily.         . Prasugrel HCl (EFFIENT PO)   Oral   Take by mouth.         Marland Kitchen. azithromycin (ZITHROMAX) 250 MG tablet   Oral   Take 250 mg by mouth daily.         . ciprofloxacin (CIPRO) 250 MG tablet   Oral   Take 250 mg by mouth 2 (two) times daily.         Marland Kitchen. doxycycline (DORYX) 100 MG EC tablet   Oral   Take 100 mg by mouth  2 (two) times daily.         . meloxicam (MOBIC) 15 MG tablet   Oral   Take 15 mg by mouth daily.         . naproxen (NAPROSYN) 500 MG tablet   Oral   Take 1 tablet (500 mg total) by mouth 2 (two) times daily with a meal. As needed for pain   20 tablet   0   . naproxen (NAPROSYN) 500 MG tablet   Oral   Take 1 tablet (500 mg total) by mouth 2 (two) times daily.   20 tablet   0   . oxyCODONE-acetaminophen (PERCOCET) 5-325 MG per tablet   Oral   Take 1 tablet by mouth every 4 (four) hours as needed. For pain.   16 tablet   0   . oxyCODONE-acetaminophen (PERCOCET/ROXICET) 5-325 MG per tablet   Oral   Take 1-2 tablets by mouth every 6 (six) hours as needed for pain.   12 tablet   0   . traMADol (ULTRAM) 50 MG tablet   Oral   Take 50 mg by mouth every 6 (six) hours as needed. For pain.          BP 133/93  Pulse 74  Temp(Src) 98.2 F (36.8 C) (Oral)  Resp 16  Ht 5\' 3"  (1.6 m)  Wt  110 lb (49.896 kg)  BMI 19.49 kg/m2  SpO2 96% Physical Exam  Nursing note and vitals reviewed. Constitutional: He is oriented to person, place, and time. No distress.  Thin, disheveled  HENT:  Head: Normocephalic and atraumatic.  Mucous membranes dry  Eyes: Pupils are equal, round, and reactive to light.  Neck: Neck supple.  Cardiovascular: Normal rate, regular rhythm and normal heart sounds.   No murmur heard. Pulmonary/Chest: Effort normal and breath sounds normal. No respiratory distress. He has no wheezes.  Abdominal: Soft. Bowel sounds are normal. There is tenderness. There is guarding. There is no rebound.  Tenderness to palpation over the right lower quadrant just above the pelvic brim with voluntary guarding  Musculoskeletal: He exhibits no edema.  Lymphadenopathy:    He has no cervical adenopathy.  Neurological: He is alert and oriented to person, place, and time.  Skin: Skin is warm and dry.  Psychiatric: He has a normal mood and affect.    ED Course  Procedures (including critical care time) Labs Review Labs Reviewed  CBC WITH DIFFERENTIAL - Abnormal; Notable for the following:    Monocytes Relative 13 (*)    All other components within normal limits  COMPREHENSIVE METABOLIC PANEL - Abnormal; Notable for the following:    Total Bilirubin 0.2 (*)    All other components within normal limits  URINALYSIS, ROUTINE W REFLEX MICROSCOPIC   Imaging Review Ct Abdomen Pelvis W Contrast  11/24/2013   CLINICAL DATA:  Right lower quadrant pain, past history of right orchiectomy secondary to trauma, hypertension, MI  EXAM: CT ABDOMEN AND PELVIS WITH CONTRAST  TECHNIQUE: Multidetector CT imaging of the abdomen and pelvis was performed using the standard protocol following bolus administration of intravenous contrast. Sagittal and coronal MPR images reconstructed from axial data set.  CONTRAST:  50mL OMNIPAQUE IOHEXOL 300 MG/ML SOLN ORALLY, OMNIPAQUE IOHEXOL 300 MG/ML SOLN IV   COMPARISON:  12/22/2012  FINDINGS: Coronary arterial calcifications and stent.  Lung bases clear.  Pectus excavatum.  Liver, spleen, pancreas, kidneys, and adrenal glands normal appearance.  Distended bladder, otherwise unremarkable.  Stomach and bowel loops normal appearance.  Proximally appendix is slightly prominent size but air-filled.  Distally, extending toward the midline, appendix is enlarged, 9-10 mm diameter with mild wall thickening raising question of distal appendicitis.  No significant periappendiceal inflammatory changes are definitely visualized, however.  Scattered atherosclerotic calcification.  No mass, adenopathy, hernia, free fluid, or free air.  Bones unremarkable.  IMPRESSION: Distal enlargement of the appendix with wall thickening new since previous exam suspicious for acute appendicitis.  No evidence of perforation or abscess.   Electronically Signed   By: Ulyses Southward M.D.   On: 11/24/2013 13:11     EKG Interpretation   Date/Time:  Wednesday November 24 2013 14:06:24 EDT Ventricular Rate:  49 PR Interval:  134 QRS Duration: 90 QT Interval:  448 QTC Calculation: 404 R Axis:   47 Text Interpretation:  Sinus bradycardia Anteroseptal infarct , age  undetermined T wave abnormality, consider lateral ischemia Abnormal ECG NO  prior for comparison Confirmed by Meliss Fleek  MD, Toni Amend ([id number]) on  11/24/2013 2:09:15 PM      MDM   Final diagnoses:  Appendicitis    Patient presents with abdominal pain x2 days. He is nontoxic-appearing. He does have tenderness on exam. Lab work is reassuring without evidence of leukocytosis. Given significant tenderness, CT scan obtained and is concerning for appendicitis. Discussed with Dr. Abbey Chatters. Will give Ertapenem and transfer to [facility].    Shon Baton, MD 11/24/13 1520

## 2013-11-24 NOTE — H&P (Signed)
Andrew Moreno is an 53 y.o. male.   Chief Complaint: RLQ abdominal pain. Dr. Gery Pray in [place] [address]   [phone] Dr. Lissa Morales cardiology [facility] [phone]  HPI: Pt presented to [facility] with pain that started 11/22/13 in the PM.  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  Pain comes and goes.  He has had prior studies in [place] we currently do not have access to.  When compared to old studies the current Ct shows: Distal enlargement of the appendix with wall thickening new since  previous exam suspicious for acute appendicitis.  He was transferred to [facility] from [facility] for treatment and evaluation.  Labs including WBC are normal   Past Medical History  Diagnosis Date  Bulging lumbar disc (sx better with new mattress   Back pain   MI (myocardial infarction)  5 stents place in [place] July, 2014/Possible CM.   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  July 2014.    . Arm wound repair / closure     Right Orchiectomy     . Knee surgery           .       History reviewed. No pertinent family history. Social History:  reports that he has been smoking Cigarettes.  He has been smoking about 1.00 pack per day. He has never used smokeless tobacco. He reports that he uses illicit drugs (Marijuana). He reports that he does not drink alcohol. Tobacco: 1-2 PPD 38 years DRugs:  Mariajuana ,last time 4-5 days ago ETOH:  Social Married Ship broker    Allergies:  Allergies  Allergen Reactions  . Penicillins Anaphylaxis    Medications Prior to Admission  Medication Sig Dispense Refill  . amLODipine (NORVASC) 5 MG tablet Take 5 mg by mouth daily.      Marland Kitchen aspirin 81 MG tablet Take 81 mg by  mouth daily.      Marland Kitchen atorvastatin (LIPITOR) 40 MG tablet Take 40 mg by mouth daily.      . carvedilol (COREG) 25 MG tablet Take 25 mg by mouth 2 (two) times daily with a meal.      . lisinopril (PRINIVIL,ZESTRIL) 20 MG tablet Take 20 mg by mouth 2 (two) times daily.      . pantoprazole (PROTONIX) 20 MG tablet Take 20 mg by mouth daily.      . Prasugrel HCl (EFFIENT PO) Take by mouth.        Results for orders placed during the hospital encounter of 11/24/13 (from the past 48 hour(s))  CBC WITH DIFFERENTIAL     Status: Abnormal   Collection Time    11/24/13  9:45 AM      Result Value Ref Range   WBC 8.0  4.0 - 10.5 K/uL   RBC 4.42  4.22 - 5.81 MIL/uL   Hemoglobin 13.8  13.0 - 17.0 g/dL   HCT 39.9  39.0 - 52.0 %   MCV 90.3  78.0 - 100.0 fL   MCH 31.2  26.0 - 34.0 pg   MCHC 34.6  30.0 - 36.0 g/dL   RDW 13.3  11.5 - 15.5 %  Platelets 243  150 - 400 K/uL   Neutrophils Relative % 54  43 - 77 %   Neutro Abs 4.3  1.7 - 7.7 K/uL   Lymphocytes Relative 29  12 - 46 %   Lymphs Abs 2.3  0.7 - 4.0 K/uL   Monocytes Relative 13 (*) 3 - 12 %   Monocytes Absolute 1.0  0.1 - 1.0 K/uL   Eosinophils Relative 3  0 - 5 %   Eosinophils Absolute 0.3  0.0 - 0.7 K/uL   Basophils Relative 1  0 - 1 %   Basophils Absolute 0.1  0.0 - 0.1 K/uL  COMPREHENSIVE METABOLIC PANEL     Status: Abnormal   Collection Time    11/24/13  9:45 AM      Result Value Ref Range   Sodium 137  137 - 147 mEq/L   Potassium 4.5  3.7 - 5.3 mEq/L   Chloride 99  96 - 112 mEq/L   CO2 23  19 - 32 mEq/L   Glucose, Bld 99  70 - 99 mg/dL   BUN 16  6 - 23 mg/dL   Creatinine, Ser 0.80  0.50 - 1.35 mg/dL   Calcium 9.7  8.4 - 10.5 mg/dL   Total Protein 7.7  6.0 - 8.3 g/dL   Albumin 4.2  3.5 - 5.2 g/dL   AST 16  0 - 37 U/L   ALT 16  0 - 53 U/L   Alkaline Phosphatase 78  39 - 117 U/L   Total Bilirubin 0.2 (*) 0.3 - 1.2 mg/dL   GFR calc non Af Amer >90  >90 mL/min   GFR calc Af Amer >90  >90 mL/min   Comment: (NOTE)     The eGFR has  been calculated using the CKD EPI equation.     This calculation has not been validated in all clinical situations.     eGFR's persistently <90 mL/min signify possible Chronic Kidney     Disease.  URINALYSIS, ROUTINE W REFLEX MICROSCOPIC     Status: None   Collection Time    11/24/13  9:46 AM      Result Value Ref Range   Color, Urine YELLOW  YELLOW   APPearance CLEAR  CLEAR   Specific Gravity, Urine 1.008  1.005 - 1.030   pH 6.5  5.0 - 8.0   Glucose, UA NEGATIVE  NEGATIVE mg/dL   Hgb urine dipstick NEGATIVE  NEGATIVE   Bilirubin Urine NEGATIVE  NEGATIVE   Ketones, ur NEGATIVE  NEGATIVE mg/dL   Protein, ur NEGATIVE  NEGATIVE mg/dL   Urobilinogen, UA 0.2  0.0 - 1.0 mg/dL   Nitrite NEGATIVE  NEGATIVE   Leukocytes, UA NEGATIVE  NEGATIVE   Comment: MICROSCOPIC NOT DONE ON URINES WITH NEGATIVE PROTEIN, BLOOD, LEUKOCYTES, NITRITE, OR GLUCOSE <1000 mg/dL.   Ct Abdomen Pelvis W Contrast  11/24/2013   CLINICAL DATA:  Right lower quadrant pain, past history of right orchiectomy secondary to trauma, hypertension, MI  EXAM: CT ABDOMEN AND PELVIS WITH CONTRAST  TECHNIQUE: Multidetector CT imaging of the abdomen and pelvis was performed using the standard protocol following bolus administration of intravenous contrast. Sagittal and coronal MPR images reconstructed from axial data set.  CONTRAST:  58m OMNIPAQUE IOHEXOL 300 MG/ML SOLN ORALLY, 1064mOMNIPAQUE IOHEXOL 300 MG/ML SOLN IV  COMPARISON:  12/22/2012  FINDINGS: Coronary arterial calcifications and stent.  Lung bases clear.  Pectus excavatum.  Liver, spleen, pancreas, kidneys, and adrenal glands normal appearance.  Distended bladder, otherwise  unremarkable.  Stomach and bowel loops normal appearance.  Proximally appendix is slightly prominent size but air-filled.  Distally, extending toward the midline, appendix is enlarged, 9-10 mm diameter with mild wall thickening raising question of distal appendicitis.  No significant periappendiceal inflammatory  changes are definitely visualized, however.  Scattered atherosclerotic calcification.  No mass, adenopathy, hernia, free fluid, or free air.  Bones unremarkable.  IMPRESSION: Distal enlargement of the appendix with wall thickening new since previous exam suspicious for acute appendicitis.  No evidence of perforation or abscess.   Electronically Signed   By: Lavonia Dana M.D.   On: 11/24/2013 13:11    Review of Systems  Constitutional: Negative.   HENT: Negative.   Eyes: Negative.   Respiratory: Negative.   Cardiovascular: Negative.   Gastrointestinal: Positive for heartburn, abdominal pain ( RLQ same pain he has had since orchiectomy 1.5 years ago, it comes and goes.) and diarrhea (1.5 weeks ago). Negative for nausea, vomiting, constipation, blood in stool and melena.  Genitourinary: Negative.        Right groin pain since orchectomy   Musculoskeletal: Negative.   Skin: Negative.   Neurological: Negative.   Endo/Heme/Allergies: Bruises/bleeds easily.       On Effient, last dose this AM  Psychiatric/Behavioral: Negative.     Blood pressure 141/79, pulse 64, temperature 98.2 F (36.8 C), temperature source Oral, resp. rate 16, height 5' 3" (1.6 m), weight 49.896 kg (110 lb), SpO2 100.00%. Physical Exam  Constitutional: He is oriented to person, place, and time. He appears well-developed.  Small, thin WM no distress,  HENT:  Head: Normocephalic and atraumatic.  Nose: Nose normal.  Eyes: Conjunctivae and EOM are normal. Pupils are equal, round, and reactive to light. Right eye exhibits no discharge. Left eye exhibits no discharge. No scleral icterus.  Neck: Normal range of motion. Neck supple. No JVD present. No tracheal deviation present. No thyromegaly present.  Cardiovascular: Normal rate, regular rhythm, normal heart sounds and intact distal pulses.  Exam reveals no gallop.   No murmur heard. Respiratory: Effort normal and breath sounds normal. No respiratory distress. He has no  wheezes. He has no rales. He exhibits no tenderness.  GI: Soft. Bowel sounds are normal. He exhibits no distension and no mass. There is tenderness (some in RLQ on palpation, he appears to be very comfortable talking.  ask about supper.). There is no rebound and no guarding.  Musculoskeletal: He exhibits no edema and no tenderness.  Lymphadenopathy:    He has no cervical adenopathy.  Neurological: He is alert and oriented to person, place, and time. No cranial nerve deficit.  Skin: Skin is warm and dry. No rash noted. No erythema. No pallor.  Psychiatric: He has a normal mood and affect. His behavior is normal. Judgment and thought content normal.     Assessment/Plan 1.  RLQ pain, with possible appendicitis 2.  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 3.  MI with 5 stents, possible CM July 2014 4.  Hypertension 5.  Chronic back pain/disc disease. 6.  GERD  Plan:  IV antibiotics, clears, hold Effient, obtain records from [facility], and cardiology consult. Recheck labs in AM  Deston Bilyeu 11/24/2013, 5:19 PM

## 2013-11-24 NOTE — ED Notes (Signed)
Pt c/o right groin pain x 2 days, constant and stabbing. Pt reports having right testicle removed.

## 2013-11-25 ENCOUNTER — Encounter (HOSPITAL_COMMUNITY): Payer: Self-pay | Admitting: Cardiology

## 2013-11-25 DIAGNOSIS — Z01810 Encounter for preprocedural cardiovascular examination: Secondary | ICD-10-CM

## 2013-11-25 DIAGNOSIS — I2510 Atherosclerotic heart disease of native coronary artery without angina pectoris: Secondary | ICD-10-CM

## 2013-11-25 LAB — CBC
HEMATOCRIT: 42 % (ref 39.0–52.0)
HEMOGLOBIN: 14.1 g/dL (ref 13.0–17.0)
MCH: 30.3 pg (ref 26.0–34.0)
MCHC: 33.6 g/dL (ref 30.0–36.0)
MCV: 90.1 fL (ref 78.0–100.0)
Platelets: 280 10*3/uL (ref 150–400)
RBC: 4.66 MIL/uL (ref 4.22–5.81)
RDW: 13.4 % (ref 11.5–15.5)
WBC: 11.9 10*3/uL — ABNORMAL HIGH (ref 4.0–10.5)

## 2013-11-25 LAB — PROTIME-INR
INR: 1.04 (ref 0.00–1.49)
PROTHROMBIN TIME: 13.4 s (ref 11.6–15.2)

## 2013-11-25 LAB — BASIC METABOLIC PANEL
BUN: 10 mg/dL (ref 6–23)
CHLORIDE: 99 meq/L (ref 96–112)
CO2: 23 mEq/L (ref 19–32)
Calcium: 9.5 mg/dL (ref 8.4–10.5)
Creatinine, Ser: 0.7 mg/dL (ref 0.50–1.35)
GFR calc Af Amer: >90 mL/min (ref >90)
GFR calc non Af Amer: >90 mL/min (ref >90)
Glucose, Bld: 74 mg/dL (ref 70–99)
Potassium: 4.3 mEq/L (ref 3.7–5.3)
Sodium: 137 mEq/L (ref 137–147)

## 2013-11-25 LAB — APTT: aPTT: 35 seconds (ref 24–37)

## 2013-11-25 MED ORDER — HEPARIN SODIUM (PORCINE) 5000 UNIT/ML IJ SOLN
5000.0000 [IU] | Freq: Three times a day (TID) | INTRAMUSCULAR | Status: DC
  Administered 2013-11-25 – 2013-11-27 (×6): 5000 [IU] via SUBCUTANEOUS
  Filled 2013-11-25 (×9): qty 1

## 2013-11-25 MED ORDER — ASPIRIN 81 MG PO CHEW
81.0000 mg | CHEWABLE_TABLET | Freq: Every day | ORAL | Status: DC
  Administered 2013-11-25 – 2013-11-26 (×2): 81 mg via ORAL
  Filled 2013-11-25 (×3): qty 1

## 2013-11-25 MED ORDER — ATORVASTATIN CALCIUM 40 MG PO TABS
40.0000 mg | ORAL_TABLET | Freq: Every day | ORAL | Status: DC
  Administered 2013-11-25 – 2013-11-26 (×2): 40 mg via ORAL
  Filled 2013-11-25 (×3): qty 1

## 2013-11-25 MED ORDER — NICOTINE 14 MG/24HR TD PT24
14.0000 mg | MEDICATED_PATCH | Freq: Every day | TRANSDERMAL | Status: DC
  Administered 2013-11-25 – 2013-11-26 (×2): 14 mg via TRANSDERMAL
  Filled 2013-11-25 (×3): qty 1

## 2013-11-25 NOTE — H&P (Signed)
Clinically, I am not convinced that he has acute appendicitis. Will treat him with abxs, wait for Cardiology evaluation and recheck WBC and exam 3/19.

## 2013-11-25 NOTE — Consult Note (Signed)
HPI: 53 year old male with past medical history of coronary artery disease, hypertension and tobacco abuse for preoperative evaluation prior to appendectomy. Patient states he suffered a myocardial infarction in July of 2014. He was seen at [facility] and had 5 stents placed by his report. Those records are not available. Since that time he denies dyspnea on exertion, orthopnea, PND, pedal edema, palpitations, syncope or exertional chest pain. He has been having right lower quadrant pain and diagnosed with appendicitis. He will require appendectomy. Cardiology is asked to evaluate preoperatively.  Medications Prior to Admission  Medication Sig Dispense Refill  . amLODipine (NORVASC) 5 MG tablet Take 5 mg by mouth daily.      Marland Kitchen aspirin 81 MG tablet Take 81 mg by mouth daily.      Marland Kitchen atorvastatin (LIPITOR) 40 MG tablet Take 40 mg by mouth daily at 6 PM.       . carvedilol (COREG) 25 MG tablet Take 25 mg by mouth 2 (two) times daily with a meal.      . lisinopril (PRINIVIL,ZESTRIL) 20 MG tablet Take 20 mg by mouth 2 (two) times daily.      . pantoprazole (PROTONIX) 20 MG tablet Take 20 mg by mouth daily.      . prasugrel (EFFIENT) 10 MG TABS tablet Take 10 mg by mouth daily.      . [DISCONTINUED] azithromycin (ZITHROMAX) 250 MG tablet Take 250 mg by mouth daily.      . [DISCONTINUED] ciprofloxacin (CIPRO) 250 MG tablet Take 250 mg by mouth 2 (two) times daily.      . [DISCONTINUED] doxycycline (DORYX) 100 MG EC tablet Take 100 mg by mouth 2 (two) times daily.      . [DISCONTINUED] meloxicam (MOBIC) 15 MG tablet Take 15 mg by mouth daily.      . [DISCONTINUED] naproxen (NAPROSYN) 500 MG tablet Take 1 tablet (500 mg total) by mouth 2 (two) times daily with a meal. As needed for pain  20 tablet  0  . [DISCONTINUED] naproxen (NAPROSYN) 500 MG tablet Take 1 tablet (500 mg total) by mouth 2 (two) times daily.  20 tablet  0  . [DISCONTINUED] oxyCODONE-acetaminophen (PERCOCET) 5-325 MG per  tablet Take 1 tablet by mouth every 4 (four) hours as needed. For pain.  16 tablet  0  . [DISCONTINUED] oxyCODONE-acetaminophen (PERCOCET/ROXICET) 5-325 MG per tablet Take 1-2 tablets by mouth every 6 (six) hours as needed for pain.  12 tablet  0  . [DISCONTINUED] traMADol (ULTRAM) 50 MG tablet Take 50 mg by mouth every 6 (six) hours as needed. For pain.        Allergies  Allergen Reactions  . Penicillins Anaphylaxis    Past Medical History  Diagnosis Date  . Bulging lumbar disc   . Back pain   . MI (myocardial infarction)   . Hypertension     Past Surgical History  Procedure Laterality Date  . Knee surgery    . Arm wound repair / closure    . Coronary angioplasty with stent placement    . Orchiectomy      History   Social History  . Marital Status: Married    Spouse Name: N/A    Number of Children: 6  . Years of Education: N/A   Occupational History  .      Electrician   Social History Main Topics  . Smoking status: Current Every Day Smoker -- 1.00 packs/day    Types: Cigarettes  .  Smokeless tobacco: Never Used  . Alcohol Use: No  . Drug Use: Yes    Special: Marijuana     Comment: Marijuana  . Sexual Activity: Not on file   Other Topics Concern  . Not on file   Social History Narrative  . No narrative on file    Family History  Problem Relation Age of Onset  . CAD Father     Angina  . Stroke Father     ROS:  Right lower quadrant pain but no fevers or chills, productive cough, hemoptysis, dysphasia, odynophagia, melena, hematochezia, dysuria, hematuria, rash, seizure activity, orthopnea, PND, pedal edema, claudication. Remaining systems are negative.  Physical Exam:   Blood pressure 124/77, pulse 74, temperature 97.4 F (36.3 C), temperature source Oral, resp. rate 18, height 5' 3"  (1.6 m), weight 110 lb (49.896 kg), SpO2 99.00%.  General:  Well developed/well nourished in NAD Skin warm/dry Patient not depressed No peripheral  clubbing Back-normal HEENT-normal/normal eyelids Neck supple/normal carotid upstroke bilaterally; no bruits; no JVD; no thyromegaly chest - CTA/ normal expansion CV - RRR/normal S1 and S2; no rubs or gallops;  PMI nondisplaced; 2/6 systolic murmur apex. Abdomen -tender to palpation right lower quadrant/ND, no HSM, no mass, + bowel sounds, no bruit 2+ femoral pulses, no bruits Ext-no edema, chords, 2+ DP Neuro-grossly nonfocal  ECG sinus bradycardia, prior septal infarct, anterior lateral T-wave inversion.  Results for orders placed during the hospital encounter of 11/24/13 (from the past 48 hour(s))  CBC WITH DIFFERENTIAL     Status: Abnormal   Collection Time    11/24/13  9:45 AM      Result Value Ref Range   WBC 8.0  4.0 - 10.5 K/uL   RBC 4.42  4.22 - 5.81 MIL/uL   Hemoglobin 13.8  13.0 - 17.0 g/dL   HCT 39.9  39.0 - 52.0 %   MCV 90.3  78.0 - 100.0 fL   MCH 31.2  26.0 - 34.0 pg   MCHC 34.6  30.0 - 36.0 g/dL   RDW 13.3  11.5 - 15.5 %   Platelets 243  150 - 400 K/uL   Neutrophils Relative % 54  43 - 77 %   Neutro Abs 4.3  1.7 - 7.7 K/uL   Lymphocytes Relative 29  12 - 46 %   Lymphs Abs 2.3  0.7 - 4.0 K/uL   Monocytes Relative 13 (*) 3 - 12 %   Monocytes Absolute 1.0  0.1 - 1.0 K/uL   Eosinophils Relative 3  0 - 5 %   Eosinophils Absolute 0.3  0.0 - 0.7 K/uL   Basophils Relative 1  0 - 1 %   Basophils Absolute 0.1  0.0 - 0.1 K/uL  COMPREHENSIVE METABOLIC PANEL     Status: Abnormal   Collection Time    11/24/13  9:45 AM      Result Value Ref Range   Sodium 137  137 - 147 mEq/L   Potassium 4.5  3.7 - 5.3 mEq/L   Chloride 99  96 - 112 mEq/L   CO2 23  19 - 32 mEq/L   Glucose, Bld 99  70 - 99 mg/dL   BUN 16  6 - 23 mg/dL   Creatinine, Ser 0.80  0.50 - 1.35 mg/dL   Calcium 9.7  8.4 - 10.5 mg/dL   Total Protein 7.7  6.0 - 8.3 g/dL   Albumin 4.2  3.5 - 5.2 g/dL   AST 16  0 - 37 U/L  ALT 16  0 - 53 U/L   Alkaline Phosphatase 78  39 - 117 U/L   Total Bilirubin 0.2 (*) 0.3  - 1.2 mg/dL   GFR calc non Af Amer >90  >90 mL/min   GFR calc Af Amer >90  >90 mL/min   Comment: (NOTE)     The eGFR has been calculated using the CKD EPI equation.     This calculation has not been validated in all clinical situations.     eGFR's persistently <90 mL/min signify possible Chronic Kidney     Disease.  URINALYSIS, ROUTINE W REFLEX MICROSCOPIC     Status: None   Collection Time    11/24/13  9:46 AM      Result Value Ref Range   Color, Urine YELLOW  YELLOW   APPearance CLEAR  CLEAR   Specific Gravity, Urine 1.008  1.005 - 1.030   pH 6.5  5.0 - 8.0   Glucose, UA NEGATIVE  NEGATIVE mg/dL   Hgb urine dipstick NEGATIVE  NEGATIVE   Bilirubin Urine NEGATIVE  NEGATIVE   Ketones, ur NEGATIVE  NEGATIVE mg/dL   Protein, ur NEGATIVE  NEGATIVE mg/dL   Urobilinogen, UA 0.2  0.0 - 1.0 mg/dL   Nitrite NEGATIVE  NEGATIVE   Leukocytes, UA NEGATIVE  NEGATIVE   Comment: MICROSCOPIC NOT DONE ON URINES WITH NEGATIVE PROTEIN, BLOOD, LEUKOCYTES, NITRITE, OR GLUCOSE <1000 mg/dL.  BASIC METABOLIC PANEL     Status: None   Collection Time    11/25/13  4:56 AM      Result Value Ref Range   Sodium 137  137 - 147 mEq/L   Potassium 4.3  3.7 - 5.3 mEq/L   Chloride 99  96 - 112 mEq/L   CO2 23  19 - 32 mEq/L   Glucose, Bld 74  70 - 99 mg/dL   BUN 10  6 - 23 mg/dL   Creatinine, Ser 0.70  0.50 - 1.35 mg/dL   Calcium 9.5  8.4 - 10.5 mg/dL   GFR calc non Af Amer >90  >90 mL/min   GFR calc Af Amer >90  >90 mL/min   Comment: (NOTE)     The eGFR has been calculated using the CKD EPI equation.     This calculation has not been validated in all clinical situations.     eGFR's persistently <90 mL/min signify possible Chronic Kidney     Disease.  CBC     Status: Abnormal   Collection Time    11/25/13  4:56 AM      Result Value Ref Range   WBC 11.9 (*) 4.0 - 10.5 K/uL   RBC 4.66  4.22 - 5.81 MIL/uL   Hemoglobin 14.1  13.0 - 17.0 g/dL   HCT 42.0  39.0 - 52.0 %   MCV 90.1  78.0 - 100.0 fL   MCH 30.3   26.0 - 34.0 pg   MCHC 33.6  30.0 - 36.0 g/dL   RDW 13.4  11.5 - 15.5 %   Platelets 280  150 - 400 K/uL  APTT     Status: None   Collection Time    11/25/13  4:56 AM      Result Value Ref Range   aPTT 35  24 - 37 seconds  PROTIME-INR     Status: None   Collection Time    11/25/13  4:56 AM      Result Value Ref Range   Prothrombin Time 13.4  11.6 - 15.2 seconds  INR 1.04  0.00 - 1.49    Ct Abdomen Pelvis W Contrast  11/24/2013   CLINICAL DATA:  Right lower quadrant pain, past history of right orchiectomy secondary to trauma, hypertension, MI  EXAM: CT ABDOMEN AND PELVIS WITH CONTRAST  TECHNIQUE: Multidetector CT imaging of the abdomen and pelvis was performed using the standard protocol following bolus administration of intravenous contrast. Sagittal and coronal MPR images reconstructed from axial data set.  CONTRAST:  62m OMNIPAQUE IOHEXOL 300 MG/ML SOLN ORALLY, 1055mOMNIPAQUE IOHEXOL 300 MG/ML SOLN IV  COMPARISON:  12/22/2012  FINDINGS: Coronary arterial calcifications and stent.  Lung bases clear.  Pectus excavatum.  Liver, spleen, pancreas, kidneys, and adrenal glands normal appearance.  Distended bladder, otherwise unremarkable.  Stomach and bowel loops normal appearance.  Proximally appendix is slightly prominent size but air-filled.  Distally, extending toward the midline, appendix is enlarged, 9-10 mm diameter with mild wall thickening raising question of distal appendicitis.  No significant periappendiceal inflammatory changes are definitely visualized, however.  Scattered atherosclerotic calcification.  No mass, adenopathy, hernia, free fluid, or free air.  Bones unremarkable.  IMPRESSION: Distal enlargement of the appendix with wall thickening new since previous exam suspicious for acute appendicitis.  No evidence of perforation or abscess.   Electronically Signed   By: MaLavonia Dana.D.   On: 11/24/2013 13:11    Assessment/Plan 1 preoperative evaluation-the patient has question  appendicitis. He is being treated with antibiotics. We were asked to evaluate prior to appendectomy. He has had no cardiac symptoms since his infarct in July. I would like to review his previous records concerning prior PCI. If his surgery is deemed necessary then I would recommend holding effient preoperatively and continuing aspirin 81 mg daily. Dual antiplatelet therapy is typically continued for one year following PCI. However the risk of stent thrombosis decreases at 6 months and overall risk with this approach relatively low. Further recommendations once records available. 2 CAD- resume aspirin 81 mg daily and statin. Continue beta blocker and ACE inhibitor. 3 tobacco abuse-patient educated on discontinuing. 4 possible appendicitis-management per general surgery.  5 hypertension-continue preadmission medications.  BrKirk RuthsD 11/25/2013, 9:32 AM

## 2013-11-25 NOTE — Progress Notes (Signed)
Patient seen and examined.  Pain remains in the right groin.  No RLQ tenderness.  WBC up some today.  Ideally, Effient needs to be stopped 7 days prior to surgery to avoid risk of excessive bleeding.  Will start on clear liquids and continue IV abxs.

## 2013-11-25 NOTE — Progress Notes (Signed)
Subjective: Telem shows sinus, pain still in RLQ.  No nausea or vomiting.  He would like to eat. He appears very comfortable working on computer in bed.  Objective: Vital signs in last 24 hours: Temp:  [97.4 F (36.3 C)-98.6 F (37 C)] 97.4 F (36.3 C) (03/19 0900) Pulse Rate:  [54-81] 81 (03/19 0900) Resp:  [16-18] 18 (03/19 0900) BP: (119-141)/(76-95) 119/77 mmHg (03/19 0900) SpO2:  [99 %-100 %] 100 % (03/19 0900) Weight:  [49.896 kg (110 lb)] 49.896 kg (110 lb) (03/18 1625) Last BM Date: 11/24/13 Afebrile, VSS WBC is up Intake/Output from previous day: 03/18 0701 - 03/19 0700 In: 1321.7 [I.V.:1321.7] Out: 900 [Urine:900] Intake/Output this shift: Total I/O In: 550 [I.V.:550] Out: 200 [Urine:200]  General appearance: alert, cooperative and no distress GI: soft, complains of pain RLQ.  No change. No distension.  Lab Results:   Recent Labs  11/24/13 0945 11/25/13 0456  WBC 8.0 11.9*  HGB 13.8 14.1  HCT 39.9 42.0  PLT 243 280    BMET  Recent Labs  11/24/13 0945 11/25/13 0456  NA 137 137  K 4.5 4.3  CL 99 99  CO2 23 23  GLUCOSE 99 74  BUN 16 10  CREATININE 0.80 0.70  CALCIUM 9.7 9.5   PT/INR  Recent Labs  11/25/13 0456  LABPROT 13.4  INR 1.04     Recent Labs Lab 11/24/13 0945  AST 16  ALT 16  ALKPHOS 78  BILITOT 0.2*  PROT 7.7  ALBUMIN 4.2     Lipase  No results found for this basename: lipase     Studies/Results: Ct Abdomen Pelvis W Contrast  11/24/2013   CLINICAL DATA:  Right lower quadrant pain, past history of right orchiectomy secondary to trauma, hypertension, MI  EXAM: CT ABDOMEN AND PELVIS WITH CONTRAST  TECHNIQUE: Multidetector CT imaging of the abdomen and pelvis was performed using the standard protocol following bolus administration of intravenous contrast. Sagittal and coronal MPR images reconstructed from axial data set.  CONTRAST:  50mL OMNIPAQUE IOHEXOL 300 MG/ML SOLN ORALLY, 100mL OMNIPAQUE IOHEXOL 300 MG/ML SOLN IV   COMPARISON:  12/22/2012  FINDINGS: Coronary arterial calcifications and stent.  Lung bases clear.  Pectus excavatum.  Liver, spleen, pancreas, kidneys, and adrenal glands normal appearance.  Distended bladder, otherwise unremarkable.  Stomach and bowel loops normal appearance.  Proximally appendix is slightly prominent size but air-filled.  Distally, extending toward the midline, appendix is enlarged, 9-10 mm diameter with mild wall thickening raising question of distal appendicitis.  No significant periappendiceal inflammatory changes are definitely visualized, however.  Scattered atherosclerotic calcification.  No mass, adenopathy, hernia, free fluid, or free air.  Bones unremarkable.  IMPRESSION: Distal enlargement of the appendix with wall thickening new since previous exam suspicious for acute appendicitis.  No evidence of perforation or abscess.   Electronically Signed   By: Ulyses SouthwardMark  Boles M.D.   On: 11/24/2013 13:11    Medications: . amLODipine  5 mg Oral Daily  . aspirin  81 mg Oral Daily  . atorvastatin  40 mg Oral q1800  . carvedilol  25 mg Oral BID WC  . ertapenem  1 g Intravenous Q24H  . lisinopril  20 mg Oral BID  . nicotine  21 mg Transdermal Once  . pantoprazole (PROTONIX) IV  40 mg Intravenous QHS    Assessment/Plan 1. RLQ pain, with possible appendicitis  2. 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  3. MI with 5 stents, possible CM July 2014  4.  Hypertension  5. Chronic back pain/disc disease.  6. GERD  Plan:  Request for medical records have been made.  Continue antibiotics.  He has been restarted on Asprin and Effient is on hold.  Continuing BB and statin.  He does not need heparin. Will discuss with Dr. Abbey Chatters.    LOS: 1 day    Andrew Moreno 11/25/2013

## 2013-11-26 ENCOUNTER — Ambulatory Visit (HOSPITAL_COMMUNITY): Payer: Medicaid Other

## 2013-11-26 ENCOUNTER — Encounter (HOSPITAL_COMMUNITY): Payer: Self-pay | Admitting: Radiology

## 2013-11-26 DIAGNOSIS — I2510 Atherosclerotic heart disease of native coronary artery without angina pectoris: Secondary | ICD-10-CM

## 2013-11-26 DIAGNOSIS — I519 Heart disease, unspecified: Secondary | ICD-10-CM

## 2013-11-26 DIAGNOSIS — K37 Unspecified appendicitis: Secondary | ICD-10-CM

## 2013-11-26 LAB — CBC
HCT: 37.6 % — ABNORMAL LOW (ref 39.0–52.0)
HEMOGLOBIN: 13.2 g/dL (ref 13.0–17.0)
MCH: 31 pg (ref 26.0–34.0)
MCHC: 35.1 g/dL (ref 30.0–36.0)
MCV: 88.3 fL (ref 78.0–100.0)
Platelets: 245 10*3/uL (ref 150–400)
RBC: 4.26 MIL/uL (ref 4.22–5.81)
RDW: 13.2 % (ref 11.5–15.5)
WBC: 6.6 10*3/uL (ref 4.0–10.5)

## 2013-11-26 MED ORDER — IOHEXOL 300 MG/ML  SOLN: 100.0000 mL | Freq: Once | INTRAMUSCULAR | Status: DC | PRN

## 2013-11-26 MED ORDER — IOHEXOL 300 MG/ML  SOLN
80.0000 mL | Freq: Once | INTRAMUSCULAR | Status: AC | PRN
  Administered 2013-11-26: 80 mL via INTRAVENOUS

## 2013-11-26 MED ORDER — IOHEXOL 300 MG/ML  SOLN
25.0000 mL | INTRAMUSCULAR | Status: AC
  Administered 2013-11-26 (×2): 25 mL via ORAL

## 2013-11-26 MED ORDER — PANTOPRAZOLE SODIUM 40 MG PO TBEC
40.0000 mg | DELAYED_RELEASE_TABLET | Freq: Every day | ORAL | Status: DC
  Administered 2013-11-26: 40 mg via ORAL
  Filled 2013-11-26 (×2): qty 1

## 2013-11-26 NOTE — Progress Notes (Signed)
  Echocardiogram 2D Echocardiogram has been performed.  Andrew Moreno 11/26/2013, 10:20 AM

## 2013-11-26 NOTE — Progress Notes (Signed)
Subjective: He seems more upset about not eating than his RLQ discomfort.  He wants to eat.  He says pain is the same but he is sitting up in bed with right leg on the floor over the side of the bed.  Using his computer as he was yesterday.  Objective: Vital signs in last 24 hours: Temp:  [97.4 F (36.3 C)-98.1 F (36.7 C)] 98 F (36.7 C) (03/20 0453) Pulse Rate:  [69-81] 69 (03/20 0453) Resp:  [16-18] 16 (03/20 0453) BP: (119-135)/(74-87) 126/85 mmHg (03/20 0453) SpO2:  [97 %-100 %] 97 % (03/20 0453) Last BM Date: 11/25/13 960 PO Afebrile, VSS WBC 6.6  Intake/Output from previous day: 03/19 0701 - 03/20 0700 In: 1800 [P.O.:960] Out: 200 [Urine:200] Intake/Output this shift:    General appearance: alert, cooperative, no distress and no complaints except RLQ pain. GI: soft, not distended, few BS.  tender RLQ.  Lab Results:   Recent Labs  11/25/13 0456 11/26/13 0350  WBC 11.9* 6.6  HGB 14.1 13.2  HCT 42.0 37.6*  PLT 280 245    BMET  Recent Labs  11/24/13 0945 11/25/13 0456  NA 137 137  K 4.5 4.3  CL 99 99  CO2 23 23  GLUCOSE 99 74  BUN 16 10  CREATININE 0.80 0.70  CALCIUM 9.7 9.5   PT/INR  Recent Labs  11/25/13 0456  LABPROT 13.4  INR 1.04     Recent Labs Lab 11/24/13 0945  AST 16  ALT 16  ALKPHOS 78  BILITOT 0.2*  PROT 7.7  ALBUMIN 4.2     Lipase  No results found for this basename: lipase     Studies/Results: Ct Abdomen Pelvis W Contrast  11/24/2013   CLINICAL DATA:  Right lower quadrant pain, past history of right orchiectomy secondary to trauma, hypertension, MI  EXAM: CT ABDOMEN AND PELVIS WITH CONTRAST  TECHNIQUE: Multidetector CT imaging of the abdomen and pelvis was performed using the standard protocol following bolus administration of intravenous contrast. Sagittal and coronal MPR images reconstructed from axial data set.  CONTRAST:  50mL OMNIPAQUE IOHEXOL 300 MG/ML SOLN ORALLY, OMNIPAQUE IOHEXOL 300 MG/ML SOLN IV   COMPARISON:  12/22/2012  FINDINGS: Coronary arterial calcifications and stent.  Lung bases clear.  Pectus excavatum.  Liver, spleen, pancreas, kidneys, and adrenal glands normal appearance.  Distended bladder, otherwise unremarkable.  Stomach and bowel loops normal appearance.  Proximally appendix is slightly prominent size but air-filled.  Distally, extending toward the midline, appendix is enlarged, 9-10 mm diameter with mild wall thickening raising question of distal appendicitis.  No significant periappendiceal inflammatory changes are definitely visualized, however.  Scattered atherosclerotic calcification.  No mass, adenopathy, hernia, free fluid, or free air.  Bones unremarkable.  IMPRESSION: Distal enlargement of the appendix with wall thickening new since previous exam suspicious for acute appendicitis.  No evidence of perforation or abscess.   Electronically Signed   By: Ulyses Southward M.D.   On: 11/24/2013 13:11    Medications: . amLODipine  5 mg Oral Daily  . aspirin  81 mg Oral Daily  . atorvastatin  40 mg Oral q1800  . carvedilol  25 mg Oral BID WC  . ertapenem  1 g Intravenous Q24H  . heparin subcutaneous  5,000 Units Subcutaneous 3 times per day  . lisinopril  20 mg Oral BID  . nicotine  14 mg Transdermal Daily  . pantoprazole (PROTONIX) IV  40 mg Intravenous QHS    Assessment/Plan 1. RLQ pain, with possible  appendicitis  2. 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  3. MI with 5 stents, possible CM July 2014  On Effient last dose 11/23/13. 4. Hypertension  5. Chronic back pain/disc disease.  6. GERD 7.  Tobacco use 8.   Chronic Mariajuana use   Plan:  We are asking for records from Cardiology and urology again.  I ask him to call also, and [place]  I plan to give him low residual diet, recheck labs in Am and repeat CT scan tomorrow.  He just does not appear in any acute discomfort.  He use morphine 6 times yesterday and 3 times this AM so  far.  He used Oxycodone 10 mg twice yesterday and twice today so far. He needs to be off Effient for 7 days before he can have surgery. Appreciate Dr. Alford HighlandMcLean's help.   LOS: 2 days    Adanely Reynoso 11/26/2013

## 2013-11-26 NOTE — Progress Notes (Addendum)
Patient ID: Andrew Moreno, male   DOB: 06/25/1961, 53 y.o.   MRN: [id number]   SUBJECTIVE: No chest pain.  He still has RLQ pain.   Scheduled Meds: . amLODipine  5 mg Oral Daily  . aspirin  81 mg Oral Daily  . atorvastatin  40 mg Oral q1800  . carvedilol  25 mg Oral BID WC  . ertapenem  1 g Intravenous Q24H  . heparin subcutaneous  5,000 Units Subcutaneous 3 times per day  . lisinopril  20 mg Oral BID  . nicotine  14 mg Transdermal Daily  . pantoprazole (PROTONIX) IV  40 mg Intravenous QHS   Continuous Infusions: . 0.9 % NaCl with KCl 20 mEq / L 100 mL/hr at 11/26/13 0151   PRN Meds:.acetaminophen, acetaminophen, diphenhydrAMINE, diphenhydrAMINE, morphine injection, ondansetron, oxyCODONE    Filed Vitals:   11/25/13 0900 11/25/13 1413 11/25/13 2030 11/26/13 0453  BP: 119/77 130/74 135/87 126/85  Pulse: 81 70 81 69  Temp: 97.4 F (36.3 C) 98.1 F (36.7 C) 97.5 F (36.4 C) 98 F (36.7 C)  TempSrc: Oral Oral Oral Oral  Resp: 18 18 16 16   Height:      Weight:      SpO2: 100% 99% 100% 97%    Intake/Output Summary (Last 24 hours) at 11/26/13 0738 Last data filed at 11/25/13 1700  Gross per 24 hour  Intake   1800 ml  Output    200 ml  Net   1600 ml    LABS: Basic Metabolic Panel:  Recent Labs  40/98/1103/18/15 0945 11/25/13 0456  NA 137 137  K 4.5 4.3  CL 99 99  CO2 23 23  GLUCOSE 99 74  BUN 16 10  CREATININE 0.80 0.70  CALCIUM 9.7 9.5   Liver Function Tests:  Recent Labs  11/24/13 0945  AST 16  ALT 16  ALKPHOS 78  BILITOT 0.2*  PROT 7.7  ALBUMIN 4.2   No results found for this basename: LIPASE, AMYLASE,  in the last 72 hours CBC:  Recent Labs  11/24/13 0945 11/25/13 0456 11/26/13 0350  WBC 8.0 11.9* 6.6  NEUTROABS 4.3  --   --   HGB 13.8 14.1 13.2  HCT 39.9 42.0 37.6*  MCV 90.3 90.1 88.3  PLT 243 280 245   Cardiac Enzymes: No results found for this basename: CKTOTAL, CKMB, CKMBINDEX, TROPONINI,  in the last 72 hours BNP: No components found  with this basename: POCBNP,  D-Dimer: No results found for this basename: DDIMER,  in the last 72 hours Hemoglobin A1C: No results found for this basename: HGBA1C,  in the last 72 hours Fasting Lipid Panel: No results found for this basename: CHOL, HDL, LDLCALC, TRIG, CHOLHDL, LDLDIRECT,  in the last 72 hours Thyroid Function Tests: No results found for this basename: TSH, T4TOTAL, FREET3, T3FREE, THYROIDAB,  in the last 72 hours Anemia Panel: No results found for this basename: VITAMINB12, FOLATE, FERRITIN, TIBC, IRON, RETICCTPCT,  in the last 72 hours  RADIOLOGY: Ct Abdomen Pelvis W Contrast  11/24/2013   CLINICAL DATA:  Right lower quadrant pain, past history of right orchiectomy secondary to trauma, hypertension, MI  EXAM: CT ABDOMEN AND PELVIS WITH CONTRAST  TECHNIQUE: Multidetector CT imaging of the abdomen and pelvis was performed using the standard protocol following bolus administration of intravenous contrast. Sagittal and coronal MPR images reconstructed from axial data set.  CONTRAST:  50mL OMNIPAQUE IOHEXOL 300 MG/ML SOLN ORALLY, 100mL OMNIPAQUE IOHEXOL 300 MG/ML SOLN IV  COMPARISON:  12/22/2012  FINDINGS: Coronary arterial calcifications and stent.  Lung bases clear.  Pectus excavatum.  Liver, spleen, pancreas, kidneys, and adrenal glands normal appearance.  Distended bladder, otherwise unremarkable.  Stomach and bowel loops normal appearance.  Proximally appendix is slightly prominent size but air-filled.  Distally, extending toward the midline, appendix is enlarged, 9-10 mm diameter with mild wall thickening raising question of distal appendicitis.  No significant periappendiceal inflammatory changes are definitely visualized, however.  Scattered atherosclerotic calcification.  No mass, adenopathy, hernia, free fluid, or free air.  Bones unremarkable.  IMPRESSION: Distal enlargement of the appendix with wall thickening new since previous exam suspicious for acute appendicitis.  No  evidence of perforation or abscess.   Electronically Signed   By: Ulyses Southward M.D.   On: 11/24/2013 13:11    PHYSICAL EXAM General: NAD Neck: No JVD, no thyromegaly or thyroid nodule.  Lungs: Clear to auscultation bilaterally with normal respiratory effort. CV: Nondisplaced PMI.  Heart regular S1/S2, no S3/S4, no murmur.  No peripheral edema.  No carotid bruit.  Normal pedal pulses.  Abdomen: Soft, RLQ tenderness, no hepatosplenomegaly, no distention.  Neurologic: Alert and oriented x 3.  Psych: Normal affect. Extremities: No clubbing or cyanosis.   TELEMETRY: Reviewed telemetry pt in NSR  ASSESSMENT AND PLAN: 53 yo with CAD s/p MI and PCI x 5 in 7/14 presented with probable acute appendicitis.  He continues on ASA 81 and beta blocker but is off Effient.  Effient typically recommended for 1 year but risk is signifcantly lower after 6 months. Agree that in this situation it is necessary to hold Effient. Restart as soon as feasible after surgery.  From a cardiac standpoint, he is stable.  I will get an echo.  Of note, still need to get his records from his cardiologist in [facility], Dr Bary Castilla.  Records obtained so far are from his PCP about back pain.   Marca Ancona 11/26/2013

## 2013-11-26 NOTE — Progress Notes (Signed)
Patient seen and examined.  Talking on his cell phone and working on his laptop computer.  No RLQ tenderness.  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  I am not convinced he has acute appendicitis so will repeat the CT tomorrow.  If no evidence of acute appendicitis is seen, he could be discharge tomorrow and arrange to see a chronic pain specialist.

## 2013-11-27 LAB — CBC
HEMATOCRIT: 36.7 % — AB (ref 39.0–52.0)
Hemoglobin: 12.4 g/dL — ABNORMAL LOW (ref 13.0–17.0)
MCH: 30.2 pg (ref 26.0–34.0)
MCHC: 33.8 g/dL (ref 30.0–36.0)
MCV: 89.3 fL (ref 78.0–100.0)
Platelets: 229 10*3/uL (ref 150–400)
RBC: 4.11 MIL/uL — ABNORMAL LOW (ref 4.22–5.81)
RDW: 13.3 % (ref 11.5–15.5)
WBC: 7.3 10*3/uL (ref 4.0–10.5)

## 2013-11-27 LAB — BASIC METABOLIC PANEL
BUN: 7 mg/dL (ref 6–23)
CALCIUM: 8.9 mg/dL (ref 8.4–10.5)
CHLORIDE: 99 meq/L (ref 96–112)
CO2: 25 mEq/L (ref 19–32)
CREATININE: 0.78 mg/dL (ref 0.50–1.35)
GFR calc non Af Amer: >90 mL/min (ref >90)
Glucose, Bld: 77 mg/dL (ref 70–99)
Potassium: 4.2 mEq/L (ref 3.7–5.3)
Sodium: 135 mEq/L — ABNORMAL LOW (ref 137–147)

## 2013-11-27 LAB — CLOSTRIDIUM DIFFICILE BY PCR: Toxigenic C. Difficile by PCR: NEGATIVE

## 2013-11-27 MED ORDER — HYDROCODONE-ACETAMINOPHEN 5-325 MG PO TABS: 1.0000 | ORAL_TABLET | Freq: Four times a day (QID) | ORAL | Status: DC | PRN

## 2013-11-27 NOTE — Discharge Summary (Signed)
Physician Discharge Summary  Patient ID:  Andrew Moreno  MRN: [id number]  DOB/AGE: Jan 27, 1961 53 y.o.  Admit date: 11/24/2013 Discharge date: 11/27/2013  Discharge Diagnoses:  1. RLQ/right inguial pain   Since orchiectomy - 2013 - by Dr. Lindley Magnus, [facility], [place]    This seems to be the primary source of his pain. 1. No evidence of acute appendicitis on CT scan.  He does have an appendicolith.  I doubt this is the source of his pain. 3. 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  He will restart this.  Sees Dr. Bary Castilla in [facility].  Would wait until he has completed one year of Effient.  At that time, if the Effient could be stopped, then could consider surgery 5. Hypertension  6. Chronic back pain/disc disease.   He has had conversations about back surgery. 7. GERD  8. Tobacco use  9. Chronic Mariajuana use   Active Problems:   Appendicitis   Preop cardiovascular exam  Operation:  None  Discharged Condition: good  Hospital Course: [name] is an 53 y.o. male whose primary care physician is Carlyle Lipa, MD and who was admitted 11/24/2013 with a chief complaint of  Chief Complaint  Patient presents with  . Groin Pain  Because of findings on CT scan on 11/24/2013, he was admitted, placed on antibiotics, kept NPO, and his Effient was stopped to see if he had appendicitis.  He had no worsening in his symptoms.  His RLQ/right groin pain is unchanged.   A repeat CT scan on 11/26/2013 shows no acute appendicitis, but does see appendoliths with a thickened appendix.    The discharge instructions were reviewed with the patient.  He will follow up with his PCP and his cardiologist. He was given copies of his CT scan reports.  Consults: None  Significant Diagnostic Studies: Results for orders placed during the hospital encounter of 11/24/13  CBC WITH DIFFERENTIAL      Result Value Ref Range   WBC 8.0  4.0 - 10.5 K/uL   RBC 4.42  4.22 - 5.81 MIL/uL    Hemoglobin 13.8  13.0 - 17.0 g/dL   HCT 78.2  95.6 - 21.3 %   MCV 90.3  78.0 - 100.0 fL   MCH 31.2  26.0 - 34.0 pg   MCHC 34.6  30.0 - 36.0 g/dL   RDW 08.6  57.8 - 46.9 %   Platelets 243  150 - 400 K/uL   Neutrophils Relative % 54  43 - 77 %   Neutro Abs 4.3  1.7 - 7.7 K/uL   Lymphocytes Relative 29  12 - 46 %   Lymphs Abs 2.3  0.7 - 4.0 K/uL   Monocytes Relative 13 (*) 3 - 12 %   Monocytes Absolute 1.0  0.1 - 1.0 K/uL   Eosinophils Relative 3  0 - 5 %   Eosinophils Absolute 0.3  0.0 - 0.7 K/uL   Basophils Relative 1  0 - 1 %   Basophils Absolute 0.1  0.0 - 0.1 K/uL  COMPREHENSIVE METABOLIC PANEL      Result Value Ref Range   Sodium 137  137 - 147 mEq/L   Potassium 4.5  3.7 - 5.3 mEq/L   Chloride 99  96 - 112 mEq/L   CO2 23  19 - 32 mEq/L   Glucose, Bld 99  70 - 99 mg/dL   BUN 16  6 - 23 mg/dL  Creatinine, Ser 0.80  0.50 - 1.35 mg/dL   Calcium 9.7  8.4 - 16.110.5 mg/dL   Total Protein 7.7  6.0 - 8.3 g/dL   Albumin 4.2  3.5 - 5.2 g/dL   AST 16  0 - 37 U/L   ALT 16  0 - 53 U/L   Alkaline Phosphatase 78  39 - 117 U/L   Total Bilirubin 0.2 (*) 0.3 - 1.2 mg/dL   GFR calc non Af Amer >90  >90 mL/min   GFR calc Af Amer >90  >90 mL/min  URINALYSIS, ROUTINE W REFLEX MICROSCOPIC      Result Value Ref Range   Color, Urine YELLOW  YELLOW   APPearance CLEAR  CLEAR   Specific Gravity, Urine 1.008  1.005 - 1.030   pH 6.5  5.0 - 8.0   Glucose, UA NEGATIVE  NEGATIVE mg/dL   Hgb urine dipstick NEGATIVE  NEGATIVE   Bilirubin Urine NEGATIVE  NEGATIVE   Ketones, ur NEGATIVE  NEGATIVE mg/dL   Protein, ur NEGATIVE  NEGATIVE mg/dL   Urobilinogen, UA 0.2  0.0 - 1.0 mg/dL   Nitrite NEGATIVE  NEGATIVE   Leukocytes, UA NEGATIVE  NEGATIVE  BASIC METABOLIC PANEL      Result Value Ref Range   Sodium 137  137 - 147 mEq/L   Potassium 4.3  3.7 - 5.3 mEq/L   Chloride 99  96 - 112 mEq/L   CO2 23  19 - 32 mEq/L   Glucose, Bld 74  70 - 99 mg/dL   BUN 10  6 - 23 mg/dL   Creatinine, Ser 0.960.70  0.50 -  1.35 mg/dL   Calcium 9.5  8.4 - 04.510.5 mg/dL   GFR calc non Af Amer >90  >90 mL/min   GFR calc Af Amer >90  >90 mL/min  CBC      Result Value Ref Range   WBC 11.9 (*) 4.0 - 10.5 K/uL   RBC 4.66  4.22 - 5.81 MIL/uL   Hemoglobin 14.1  13.0 - 17.0 g/dL   HCT 40.942.0  81.139.0 - 91.452.0 %   MCV 90.1  78.0 - 100.0 fL   MCH 30.3  26.0 - 34.0 pg   MCHC 33.6  30.0 - 36.0 g/dL   RDW 78.213.4  95.611.5 - 21.315.5 %   Platelets 280  150 - 400 K/uL  APTT      Result Value Ref Range   aPTT 35  24 - 37 seconds  PROTIME-INR      Result Value Ref Range   Prothrombin Time 13.4  11.6 - 15.2 seconds   INR 1.04  0.00 - 1.49  CBC      Result Value Ref Range   WBC 6.6  4.0 - 10.5 K/uL   RBC 4.26  4.22 - 5.81 MIL/uL   Hemoglobin 13.2  13.0 - 17.0 g/dL   HCT 08.637.6 (*) 57.839.0 - 46.952.0 %   MCV 88.3  78.0 - 100.0 fL   MCH 31.0  26.0 - 34.0 pg   MCHC 35.1  30.0 - 36.0 g/dL   RDW 62.913.2  52.811.5 - 41.315.5 %   Platelets 245  150 - 400 K/uL  CBC      Result Value Ref Range   WBC 7.3  4.0 - 10.5 K/uL   RBC 4.11 (*) 4.22 - 5.81 MIL/uL   Hemoglobin 12.4 (*) 13.0 - 17.0 g/dL   HCT 24.436.7 (*) 01.039.0 - 27.252.0 %   MCV 89.3  78.0 - 100.0  fL   MCH 30.2  26.0 - 34.0 pg   MCHC 33.8  30.0 - 36.0 g/dL   RDW 98.1  19.1 - 47.8 %   Platelets 229  150 - 400 K/uL  BASIC METABOLIC PANEL      Result Value Ref Range   Sodium 135 (*) 137 - 147 mEq/L   Potassium 4.2  3.7 - 5.3 mEq/L   Chloride 99  96 - 112 mEq/L   CO2 25  19 - 32 mEq/L   Glucose, Bld 77  70 - 99 mg/dL   BUN 7  6 - 23 mg/dL   Creatinine, Ser 2.95  0.50 - 1.35 mg/dL   Calcium 8.9  8.4 - 62.1 mg/dL   GFR calc non Af Amer >90  >90 mL/min   GFR calc Af Amer >90  >90 mL/min    Ct Pelvis W Contrast  11/26/2013   CLINICAL DATA:  Followup possible appendicitis.  EXAM: CT PELVIS WITH CONTRAST  TECHNIQUE: Multidetector CT imaging of the pelvis was performed using the standard protocol following the bolus administration of intravenous contrast.  CONTRAST:  80mL OMNIPAQUE IOHEXOL 300 MG/ML  SOLN   COMPARISON:  11/24/2013 and 04/15/2013  FINDINGS: Again demonstrated is mild mid distal appendiceal wall thickening and dilatation. There are also persistent appendicoliths with the largest in the proximal appendix. This most likely reflects chronic appendiceal inflammation possibly due to irritating appendicoliths. No findings to suggest acute appendicitis. There is no periappendiceal inflammatory change or fluid.  IMPRESSION: Mild chronic wall thickening of the appendix possibly due to chronic inflammation related to appendicoliths. I do not see any definite findings to suggest acute appendicitis.   Electronically Signed   By: Loralie Champagne M.D.   On: 11/26/2013 14:04   Ct Abdomen Pelvis W Contrast  11/24/2013   CLINICAL DATA:  Right lower quadrant pain, past history of right orchiectomy secondary to trauma, hypertension, MI  EXAM: CT ABDOMEN AND PELVIS WITH CONTRAST  TECHNIQUE: Multidetector CT imaging of the abdomen and pelvis was performed using the standard protocol following bolus administration of intravenous contrast. Sagittal and coronal MPR images reconstructed from axial data set.  CONTRAST:  50mL OMNIPAQUE IOHEXOL 300 MG/ML SOLN ORALLY, OMNIPAQUE IOHEXOL 300 MG/ML SOLN IV  COMPARISON:  12/22/2012  FINDINGS: Coronary arterial calcifications and stent.  Lung bases clear.  Pectus excavatum.  Liver, spleen, pancreas, kidneys, and adrenal glands normal appearance.  Distended bladder, otherwise unremarkable.  Stomach and bowel loops normal appearance.  Proximally appendix is slightly prominent size but air-filled.  Distally, extending toward the midline, appendix is enlarged, 9-10 mm diameter with mild wall thickening raising question of distal appendicitis.  No significant periappendiceal inflammatory changes are definitely visualized, however.  Scattered atherosclerotic calcification.  No mass, adenopathy, hernia, free fluid, or free air.  Bones unremarkable.  IMPRESSION: Distal enlargement of  the appendix with wall thickening new since previous exam suspicious for acute appendicitis.  No evidence of perforation or abscess.   Electronically Signed   By: Ulyses Southward M.D.   On: 11/24/2013 13:11    Discharge Exam:  Filed Vitals:   11/27/13 0704  BP: 119/78  Pulse: 59  Temp: 97.2 F (36.2 C)  Resp: 18    General: Thin small WM who is alert and generally healthy appearing.  Lungs: Clear to auscultation and symmetric breath sounds. Heart:  RRR. No murmur or rub. Abdomen: Soft. No mass.  No hernia. Normal bowel sounds. He has some pain in the right groin/RLQ,  which is not peritoneal.  This seems to be from his prior orchiectomy.  Discharge Medications:     Medication List    TAKE these medications       amLODipine 5 MG tablet  Commonly known as:  NORVASC  Take 5 mg by mouth daily.     aspirin 81 MG tablet  Take 81 mg by mouth daily.     atorvastatin 40 MG tablet  Commonly known as:  LIPITOR  Take 40 mg by mouth daily at 6 PM.     carvedilol 25 MG tablet  Commonly known as:  COREG  Take 25 mg by mouth 2 (two) times daily with a meal.     HYDROcodone-acetaminophen 5-325 MG per tablet  Commonly known as:  NORCO/VICODIN  Take 1-2 tablets by mouth every 6 (six) hours as needed.     lisinopril 20 MG tablet  Commonly known as:  PRINIVIL,ZESTRIL  Take 20 mg by mouth 2 (two) times daily.     pantoprazole 20 MG tablet  Commonly known as:  PROTONIX  Take 20 mg by mouth daily.     prasugrel 10 MG Tabs tablet  Commonly known as:  EFFIENT  Take 10 mg by mouth daily.      ASK your doctor about these medications       azithromycin 250 MG tablet  Commonly known as:  ZITHROMAX  Take 250 mg by mouth daily.     ciprofloxacin 250 MG tablet  Commonly known as:  CIPRO  Take 250 mg by mouth 2 (two) times daily.     doxycycline 100 MG EC tablet  Commonly known as:  DORYX  Take 100 mg by mouth 2 (two) times daily.     meloxicam 15 MG tablet  Commonly known as:  MOBIC   Take 15 mg by mouth daily.     naproxen 500 MG tablet  Commonly known as:  NAPROSYN  Take 1 tablet (500 mg total) by mouth 2 (two) times daily.     oxyCODONE-acetaminophen 5-325 MG per tablet  Commonly known as:  PERCOCET/ROXICET  Take 1-2 tablets by mouth every 6 (six) hours as needed for pain.     traMADol 50 MG tablet  Commonly known as:  ULTRAM  Take 50 mg by mouth every 6 (six) hours as needed. For pain.        Disposition: 01-Home or Self Care      Discharge Orders   Future Orders Complete By Expires   Diet - low sodium heart healthy  As directed    Increase activity slowly  As directed      Activity:  Driving - May drive   Lifting - No limit  Diet:  As tolerated  Follow up appointment:  Call Dr. Abbey Chatters or Dr. Allene Pyo office [facility]) at [phone] to discuss appendectomy this fall, if still having pain.  Medications and dosages:  Resume your home medications.  You have a prescription for:  Vicodin  Signed: Ovidio Kin, M.D., [facility] Office:  [phone]  11/27/2013, 8:49 AM

## 2013-11-27 NOTE — Progress Notes (Signed)
Subjective:  No cardiac c/o.  Evidently thought that this is something other than appendicitis. HAD MI with stenting in [place] and has reduced LV function.  Objective:  Vital Signs in the last 24 hours: BP 119/78  Pulse 59  Temp(Src) 97.2 F (36.2 C) (Oral)  Resp 18  Ht 5\' 3"  (1.6 m)  Wt 49.896 kg (110 lb)  BMI 19.49 kg/m2  SpO2 99%  Physical Exam: Thin WM in NAD Lungs:  Clear  Cardiac:  Regular rhythm, normal S1 and S2, no S3 Extremities:  No edema present  Intake/Output from previous day: 03/20 0701 - 03/21 0700 In: 3673.3 [P.O.:1920; I.V.:1653.3; IV Piggyback:100] Out: 1255 [Urine:1252; Stool:3] Weight Filed Weights   11/24/13 0855 11/24/13 1625  Weight: 49.896 kg (110 lb) 49.896 kg (110 lb)    Lab Results: Basic Metabolic Panel:  Recent Labs  40/98/1103/19/15 0456 11/27/13 0419  NA 137 135*  K 4.3 4.2  CL 99 99  CO2 23 25  GLUCOSE 74 77  BUN 10 7  CREATININE 0.70 0.78    CBC:  Recent Labs  11/24/13 0945  11/26/13 0350 11/27/13 0419  WBC 8.0  < > 6.6 7.3  NEUTROABS 4.3  --   --   --   HGB 13.8  < > 13.2 12.4*  HCT 39.9  < > 37.6* 36.7*  MCV 90.3  < > 88.3 89.3  PLT 243  < > 245 229  < > = values in this interval not displayed.  Telemetry: Sinus rhythm  Assessment/Plan:  1. CAD with prior ant MI and reduced LV function currently stable  Rec:  If no surgery planned would restart Effient and usual cardiac meds and f/u with his regular cardiologist.    Darden PalmerW. Spencer Tilley, Jr.  MD [facility]  11/27/2013, 8:20 AM

## 2013-11-27 NOTE — Progress Notes (Signed)
General Surgery Note  LOS: 3 days  POD -     Assessment/Plan: 1.  Questionable appendicitis  On invanz Repeat CT scan last PM - 11/26/2013 - was negative for acute appendicitis 2.  RLQ pain  Since orchiectomy - 2013  Discharge note done.  3. DVT prophylaxis - SQ heparin  4. 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  5. Hypertension  6. Chronic back pain/disc disease.  7. GERD  8. Tobacco use  9. Chronic Mariajuana use   Active Problems:   Appendicitis   Preop cardiovascular exam   Subjective:  Ready to go home Objective:   Filed Vitals:   11/27/13 0704  BP: 119/78  Pulse: 59  Temp: 97.2 F (36.2 C)  Resp: 18     Intake/Output from previous day:  03/20 0701 - 03/21 0700 In: 3673.3 [P.O.:1920; I.V.:1653.3; IV Piggyback:100] Out: 1255 [Urine:1252; Stool:3]  Intake/Output this shift:  Total I/O In: 240 [P.O.:240] Out: -    Physical Exam:   General: Thin WM who is alert and oriented.    HEENT: Normal. Pupils equal.   Abdomen: Soft.  No peritoneal signs   Lab Results:    Recent Labs  11/26/13 0350 11/27/13 0419  WBC 6.6 7.3  HGB 13.2 12.4*  HCT 37.6* 36.7*  PLT 245 229    BMET   Recent Labs  11/25/13 0456 11/27/13 0419  NA 137 135*  K 4.3 4.2  CL 99 99  CO2 23 25  GLUCOSE 74 77  BUN 10 7  CREATININE 0.70 0.78  CALCIUM 9.5 8.9    PT/INR   Recent Labs  11/25/13 0456  LABPROT 13.4  INR 1.04    ABG  No results found for this basename: PHART, PCO2, PO2, HCO3,  in the last 72 hours   Studies/Results:  Ct Pelvis W Contrast  11/26/2013   CLINICAL DATA:  Followup possible appendicitis.  EXAM: CT PELVIS WITH CONTRAST  TECHNIQUE: Multidetector CT imaging of the pelvis was performed using the standard protocol following the bolus administration of intravenous contrast.  CONTRAST:  80mL OMNIPAQUE IOHEXOL 300 MG/ML  SOLN  COMPARISON:  11/24/2013 and 04/15/2013  FINDINGS: Again demonstrated is mild mid distal  appendiceal wall thickening and dilatation. There are also persistent appendicoliths with the largest in the proximal appendix. This most likely reflects chronic appendiceal inflammation possibly due to irritating appendicoliths. No findings to suggest acute appendicitis. There is no periappendiceal inflammatory change or fluid.  IMPRESSION: Mild chronic wall thickening of the appendix possibly due to chronic inflammation related to appendicoliths. I do not see any definite findings to suggest acute appendicitis.   Electronically Signed   By: Loralie ChampagneMark  Gallerani M.D.   On: 11/26/2013 14:04     Anti-infectives:   Anti-infectives   Start     Dose/Rate Route Frequency Ordered Stop   11/25/13 1400  ertapenem (INVANZ) 1 g in sodium chloride 0.9 % 50 mL IVPB     1 g 100 mL/hr over 30 Minutes Intravenous Every 24 hours 11/24/13 1702     11/24/13 1415  ertapenem (INVANZ) 1 g in sodium chloride 0.9 % 50 mL IVPB     1 g 100 mL/hr over 30 Minutes Intravenous  Once 11/24/13 1407 11/24/13 1537      Ovidio Kinavid Nathan Moctezuma, MD, FACS Pager: [phone] [facility] [place] Office: [phone] 11/27/2013

## 2013-11-27 NOTE — Discharge Instructions (Signed)
[facility] - DISCHARGE INSTRUCTIONS TO PATIENT  Activity:  Driving - May drive   Lifting - No limit  Diet:  As tolerated  Follow up appointment:  Call Dr. Abbey Chattersosenbower or Dr. Allene PyoNewman's office [facility]) at [phone] to discuss appendectomy this fall, if still having pain.  Medications and dosages:  Resume your home medications.  You have a prescription for:  Vicodin  Call Dr. Evelena Peatosenbower/Paysen Goza or his office  [phone]) if you have:  Temperature greater than 100.4,  Persistent nausea and vomiting,  Severe uncontrolled pain,  Redness, tenderness, or signs of infection (pain, swelling, redness, odor or green/yellow discharge around the site),  Difficulty breathing, headache or visual disturbances,  Any other questions or concerns you may have after discharge.  In an emergency, call 911 or go to an Emergency Department at a nearby hospital.

## 2013-12-30 ENCOUNTER — Emergency Department (HOSPITAL_BASED_OUTPATIENT_CLINIC_OR_DEPARTMENT_OTHER)
Admission: EM | Admit: 2013-12-30 | Discharge: 2013-12-30 | Disposition: A | Discharge status: Home / Self Care | Payer: Medicaid Other | Attending: Emergency Medicine | Admitting: Emergency Medicine

## 2013-12-30 ENCOUNTER — Encounter (HOSPITAL_BASED_OUTPATIENT_CLINIC_OR_DEPARTMENT_OTHER): Payer: Self-pay | Admitting: Emergency Medicine

## 2013-12-30 ENCOUNTER — Emergency Department (HOSPITAL_BASED_OUTPATIENT_CLINIC_OR_DEPARTMENT_OTHER): Payer: Medicaid Other

## 2013-12-30 DIAGNOSIS — I10 Essential (primary) hypertension: Secondary | ICD-10-CM | POA: Insufficient documentation

## 2013-12-30 DIAGNOSIS — Z7982 Long term (current) use of aspirin: Secondary | ICD-10-CM | POA: Insufficient documentation

## 2013-12-30 DIAGNOSIS — Z8739 Personal history of other diseases of the musculoskeletal system and connective tissue: Secondary | ICD-10-CM | POA: Insufficient documentation

## 2013-12-30 DIAGNOSIS — Y990 Civilian activity done for income or pay: Secondary | ICD-10-CM | POA: Insufficient documentation

## 2013-12-30 DIAGNOSIS — Z79899 Other long term (current) drug therapy: Secondary | ICD-10-CM | POA: Insufficient documentation

## 2013-12-30 DIAGNOSIS — Y9289 Other specified places as the place of occurrence of the external cause: Secondary | ICD-10-CM | POA: Insufficient documentation

## 2013-12-30 DIAGNOSIS — Z9861 Coronary angioplasty status: Secondary | ICD-10-CM | POA: Insufficient documentation

## 2013-12-30 DIAGNOSIS — Z880 Allergy status to penicillin: Secondary | ICD-10-CM | POA: Insufficient documentation

## 2013-12-30 DIAGNOSIS — F17200 Nicotine dependence, unspecified, uncomplicated: Secondary | ICD-10-CM | POA: Insufficient documentation

## 2013-12-30 DIAGNOSIS — S82892A Other fracture of left lower leg, initial encounter for closed fracture: Secondary | ICD-10-CM

## 2013-12-30 DIAGNOSIS — Y9339 Activity, other involving climbing, rappelling and jumping off: Secondary | ICD-10-CM | POA: Insufficient documentation

## 2013-12-30 DIAGNOSIS — S82899A Other fracture of unspecified lower leg, initial encounter for closed fracture: Secondary | ICD-10-CM | POA: Insufficient documentation

## 2013-12-30 DIAGNOSIS — I252 Old myocardial infarction: Secondary | ICD-10-CM | POA: Insufficient documentation

## 2013-12-30 DIAGNOSIS — X500XXA Overexertion from strenuous movement or load, initial encounter: Secondary | ICD-10-CM | POA: Insufficient documentation

## 2013-12-30 MED ORDER — HYDROCODONE-ACETAMINOPHEN 5-325 MG PO TABS: 1.0000 | ORAL_TABLET | Freq: Four times a day (QID) | ORAL | Status: DC | PRN

## 2013-12-30 MED ORDER — OXYCODONE-ACETAMINOPHEN 5-325 MG PO TABS
2.0000 | ORAL_TABLET | Freq: Once | ORAL | Status: AC
  Administered 2013-12-30: 2 via ORAL
  Filled 2013-12-30: qty 2

## 2013-12-30 NOTE — ED Notes (Signed)
Twisted his left ankle. Swelling and pain.

## 2013-12-30 NOTE — ED Provider Notes (Signed)
CSN: [id number]     Arrival date & time 12/30/13  1935 History  This chart was scribed for Bow Buntyn B. Bernette MayersSheldon, MD by Charline BillsEssence Howell, ED Scribe. The patient was seen in room MH10/MH10. Patient's care was started at 8:17 PM.    Chief Complaint  Patient presents with  . Ankle Injury    The history is provided by the patient. No language interpreter was used.   HPI Comments: Andrew Moreno is a 53 y.o. male who presents to the Emergency Department complaining of L ankle pain onset earlier today. Pt states that he climbed a wall at work and twisted his ankle upon landing. He reports a throbbing and burning sensation to his ankle.  Past Medical History  Diagnosis Date  . Bulging lumbar disc   . Back pain   . MI (myocardial infarction)   . Hypertension    Past Surgical History  Procedure Laterality Date  . Knee surgery    . Arm wound repair / closure    . Coronary angioplasty with stent placement    . Orchiectomy     Family History  Problem Relation Age of Onset  . CAD Father     Angina  . Stroke Father    History  Substance Use Topics  . Smoking status: Current Every Day Smoker -- 1.00 packs/day    Types: Cigarettes  . Smokeless tobacco: Never Used  . Alcohol Use: No    Review of Systems  Musculoskeletal: Positive for arthralgias and joint swelling.  All other systems reviewed and are negative.   Allergies  Penicillins  Home Medications   Prior to Admission medications   Medication Sig Start Date End Date Taking? Authorizing Provider  amLODipine (NORVASC) 5 MG tablet Take 5 mg by mouth daily.    Historical Provider, MD  aspirin 81 MG tablet Take 81 mg by mouth daily.    Historical Provider, MD  atorvastatin (LIPITOR) 40 MG tablet Take 40 mg by mouth daily at 6 PM.     Historical Provider, MD  carvedilol (COREG) 25 MG tablet Take 25 mg by mouth 2 (two) times daily with a meal.    Historical Provider, MD  HYDROcodone-acetaminophen (NORCO/VICODIN) 5-325 MG per tablet Take  1-2 tablets by mouth every 6 (six) hours as needed. 11/27/13   Kandis Cockingavid H Newman, MD  lisinopril (PRINIVIL,ZESTRIL) 20 MG tablet Take 20 mg by mouth 2 (two) times daily.    Historical Provider, MD  pantoprazole (PROTONIX) 20 MG tablet Take 20 mg by mouth daily.    Historical Provider, MD  prasugrel (EFFIENT) 10 MG TABS tablet Take 10 mg by mouth daily.    Historical Provider, MD   Triage Vitals: BP 124/82  Pulse 69  Temp(Src) 98.5 F (36.9 C) (Oral)  Resp 18  Ht 5\' 3"  (1.6 m)  Wt 110 lb (49.896 kg)  BMI 19.49 kg/m2  SpO2 98% Physical Exam  Constitutional: He is oriented to person, place, and time. He appears well-developed and well-nourished.  HENT:  Head: Normocephalic and atraumatic.  Neck: Neck supple.  Pulmonary/Chest: Effort normal.  Musculoskeletal:  Tenderness, swelling, ecchymosis to L lateral malleolus   Neurological: He is alert and oriented to person, place, and time. No cranial nerve deficit.  Psychiatric: He has a normal mood and affect. His behavior is normal.    ED Course  Procedures (including critical care time) DIAGNOSTIC STUDIES: Oxygen Saturation is 98% on RA, normal by my interpretation.    COORDINATION OF CARE: 8:20 PM-Discussed treatment plan  which includes a boot, medication for pain and follow-up with specialist with pt at bedside and pt agreed to plan.   Labs Review Labs Reviewed - No data to display  Imaging Review Dg Ankle Complete Left  12/30/2013   CLINICAL DATA:  Fall.  EXAM: LEFT ANKLE COMPLETE - 3+ VIEW  COMPARISON:  None.  FINDINGS: Soft tissue swelling over the lateral malleolus. Tiny avulsion fracture is noted arising from the tip of the lateral malleolus or lateral aspect of the talus. No other focal abnormality identified.  IMPRESSION: 1. Soft tissue swelling over the lateral malleolus. 2. Subtle avulsion fracture arising from the tip of the lateral malleolus or the lateral aspect of the talus.   Electronically Signed   By: Maisie Fushomas  Register    On: 12/30/2013 20:07     EKG Interpretation None      MDM   Final diagnoses:  Avulsion fracture of left ankle   Xray images reviewed with patient. Cam walker, followup with Dr. Pearletha ForgeHudnall. Pain meds as needed.   I personally performed the services described in this documentation, which was scribed in my presence. The recorded information has been reviewed and is accurate.    Jerrika Ledlow B. Bernette MayersSheldon, MD 12/30/13 2027

## 2013-12-30 NOTE — Discharge Instructions (Signed)
°  Ankle Fracture °A fracture is a break in the bone. A cast or splint is used to protect and keep your injured bone from moving.  °HOME CARE INSTRUCTIONS  °· Use your crutches as directed. °· To lessen the swelling, keep the injured leg elevated while sitting or lying down. °· Apply ice to the injury for 15-20 minutes, 03-04 times per day while awake for 2 days. Put the ice in a plastic bag and place a thin towel between the bag of ice and your cast. °· If you have a plaster or fiberglass cast: °· Do not try to scratch the skin under the cast using sharp or pointed objects. °· Check the skin around the cast every day. You may put lotion on any red or sore areas. °· Keep your cast dry and clean. °· If you have a plaster splint: °· Wear the splint as directed. °· You may loosen the elastic around the splint if your toes become numb, tingle, or turn cold or blue. °· Do not put pressure on any part of your cast or splint; it may break. Rest your cast only on a pillow the first 24 hours until it is fully hardened. °· Your cast or splint can be protected during bathing with a plastic bag. Do not lower the cast or splint into water. °· Take medications as directed by your caregiver. Only take over-the-counter or prescription medicines for pain, discomfort, or fever as directed by your caregiver. °· Do not drive a vehicle until your caregiver specifically tells you it is safe to do so. °· If your caregiver has given you a follow-up appointment, it is very important to keep that appointment. Not keeping the appointment could result in a chronic or permanent injury, pain, and disability. If there is any problem keeping the appointment, you must call back to this facility for assistance. °SEEK IMMEDIATE MEDICAL CARE IF:  °· Your cast gets damaged or breaks. °· You have continued severe pain or more swelling than you did before the cast was put on. °· Your skin or toenails below the injury turn blue or gray, or feel cold or  numb. °· There is a bad smell or new stains and/or purulent (pus like) drainage coming from under the cast. °If you do not have a window in your cast for observing the wound, a discharge or minor bleeding may show up as a stain on the outside of your cast. Report these findings to your caregiver. °MAKE SURE YOU:  °· Understand these instructions. °· Will watch your condition. °· Will get help right away if you are not doing well or get worse. °Document Released: 08/23/2000 Document Revised: 11/18/2011 Document Reviewed: 03/25/2013 °[employer]® Patient Information ©2014 [employer]. ° ° °

## 2014-01-03 ENCOUNTER — Ambulatory Visit (INDEPENDENT_AMBULATORY_CARE_PROVIDER_SITE_OTHER): Payer: Medicaid Other | Admitting: Family Medicine

## 2014-01-03 ENCOUNTER — Ambulatory Visit: Payer: Medicaid Other | Admitting: Family Medicine

## 2014-01-03 ENCOUNTER — Encounter: Payer: Self-pay | Admitting: Family Medicine

## 2014-01-03 VITALS — BP 134/86 | HR 59 | Ht 63.0 in | Wt 110.0 lb

## 2014-01-03 DIAGNOSIS — M25579 Pain in unspecified ankle and joints of unspecified foot: Secondary | ICD-10-CM

## 2014-01-03 DIAGNOSIS — S99929A Unspecified injury of unspecified foot, initial encounter: Secondary | ICD-10-CM

## 2014-01-03 DIAGNOSIS — S99912A Unspecified injury of left ankle, initial encounter: Secondary | ICD-10-CM

## 2014-01-03 DIAGNOSIS — S99919A Unspecified injury of unspecified ankle, initial encounter: Secondary | ICD-10-CM

## 2014-01-03 DIAGNOSIS — S8990XA Unspecified injury of unspecified lower leg, initial encounter: Secondary | ICD-10-CM

## 2014-01-03 DIAGNOSIS — M25572 Pain in left ankle and joints of left foot: Secondary | ICD-10-CM

## 2014-01-03 MED ORDER — HYDROCODONE-ACETAMINOPHEN 5-325 MG PO TABS: 1.0000 | ORAL_TABLET | Freq: Four times a day (QID) | ORAL | Status: AC | PRN

## 2014-01-03 NOTE — Patient Instructions (Signed)
You have a small lateral malleolus avulsion fracture. These are treated similar to a severe ankle sprain. Ice the area for 15 minutes at a time, 3-4 times a day Aleve 2 tabs twice a day with food OR ibuprofen 3 tabs three times a day with food for pain and inflammation. Norco as needed for severe pain (no driving on this). Elevate above the level of your heart when possible. Crutches if needed to help with walking Bear weight when tolerated Use boot next 2 weeks when up and walking around. Come out of the boot/brace twice a day to do Up/down and alphabet exercises 2-3 sets of each. Follow up with me in 2 weeks.

## 2014-01-04 ENCOUNTER — Encounter: Payer: Self-pay | Admitting: Family Medicine

## 2014-01-05 ENCOUNTER — Encounter: Payer: Self-pay | Admitting: Family Medicine

## 2014-01-05 DIAGNOSIS — S99912A Unspecified injury of left ankle, initial encounter: Secondary | ICD-10-CM | POA: Insufficient documentation

## 2014-01-05 NOTE — Progress Notes (Signed)
Patient ID: Andrew Moreno, male   DOB: 06/04/1961, 53 y.o.   MRN: [id number]  PCP: Carlyle LipaGAITHER,MICHALE D, MD  Subjective:   HPI: Patient is a 53 y.o. male here for left ankle injury.  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 Lots of swelling, bruising. Could only put a little weight on this. Went to ED where x-rays showed a small avulsion fracture of lateral malleolus. Taking hydrocodone, icing. Using crutches also. No prior injuries to this ankle.  Past Medical History  Diagnosis Date  . Bulging lumbar disc   . Back pain   . MI (myocardial infarction)   . Hypertension     Current Outpatient Prescriptions on File Prior to Visit  Medication Sig Dispense Refill  . amLODipine (NORVASC) 5 MG tablet Take 5 mg by mouth daily.      Marland Kitchen. aspirin 81 MG tablet Take 81 mg by mouth daily.      Marland Kitchen. atorvastatin (LIPITOR) 40 MG tablet Take 40 mg by mouth daily at 6 PM.       . carvedilol (COREG) 25 MG tablet Take 25 mg by mouth 2 (two) times daily with a meal.      . lisinopril (PRINIVIL,ZESTRIL) 20 MG tablet Take 20 mg by mouth 2 (two) times daily.      . pantoprazole (PROTONIX) 20 MG tablet Take 20 mg by mouth daily.      . prasugrel (EFFIENT) 10 MG TABS tablet Take 10 mg by mouth daily.       No current facility-administered medications on file prior to visit.    Past Surgical History  Procedure Laterality Date  . Knee surgery    . Arm wound repair / closure    . Coronary angioplasty with stent placement    . Orchiectomy      Allergies  Allergen Reactions  . Penicillins Anaphylaxis    History   Social History  . Marital Status: Married    Spouse Name: N/A    Number of Children: 6  . Years of Education: N/A   Occupational History  .      Electrician   Social History Main Topics  . Smoking status: Current Every Day Smoker -- 1.00 packs/day    Types: Cigarettes  . Smokeless tobacco: Never Used  . Alcohol  Use: No  . Drug Use: Yes    Special: Marijuana     Comment: Marijuana  . Sexual Activity: Not on file   Other Topics Concern  . Not on file   Social History Narrative  . No narrative on file    Family History  Problem Relation Age of Onset  . CAD Father     Angina  . Stroke Father   . Hyperlipidemia Father   . Hypertension Father   . Sudden death Neg Hx   . Heart attack Neg Hx   . Diabetes Neg Hx     BP 134/86  Pulse 59  Ht 5\' 3"  (1.6 m)  Wt 110 lb (49.896 kg)  BMI 19.49 kg/m2  Review of Systems: See HPI above.    Objective:  Physical Exam:  Gen: NAD  Left ankle: Mild lateral swelling, bruising to top of foot. Mod limitation all directions TTP greatest lateral malleolus, less base 5th metatarsal.  No other tenderness of ankle. Painful 1+ ant drawer and talar tilt.   Painful syndesmotic compression. Thompsons test negative. NV intact distally.    Assessment &  Plan:  1. Left ankle injury - 2/2 small lateral malleolus avulsion fracture.  No evidence base 5th metatarsal fracture.  Icing, nsaids with norco as needed.  Cam walker with crutches.  Start home ROM exercises.  F/u in 2 weeks.  See work note as well.

## 2014-01-05 NOTE — Assessment & Plan Note (Signed)
2/2 small lateral malleolus avulsion fracture.  No evidence base 5th metatarsal fracture.  Icing, nsaids with norco as needed.  Cam walker with crutches.  Start home ROM exercises.  F/u in 2 weeks.  See work note as well.

## 2014-01-17 ENCOUNTER — Encounter: Payer: Self-pay | Admitting: Family Medicine

## 2014-01-17 ENCOUNTER — Ambulatory Visit (INDEPENDENT_AMBULATORY_CARE_PROVIDER_SITE_OTHER): Payer: Medicaid Other | Admitting: Family Medicine

## 2014-01-17 VITALS — BP 108/70 | HR 66 | Ht 63.0 in | Wt 110.0 lb

## 2014-01-17 DIAGNOSIS — S99919A Unspecified injury of unspecified ankle, initial encounter: Secondary | ICD-10-CM

## 2014-01-17 DIAGNOSIS — S99912A Unspecified injury of left ankle, initial encounter: Secondary | ICD-10-CM

## 2014-01-17 DIAGNOSIS — S99929A Unspecified injury of unspecified foot, initial encounter: Secondary | ICD-10-CM

## 2014-01-17 DIAGNOSIS — S8990XA Unspecified injury of unspecified lower leg, initial encounter: Secondary | ICD-10-CM

## 2014-01-17 NOTE — Patient Instructions (Signed)
You have a small lateral malleolus avulsion fracture. Add ankle strengthening exercises - 3 sets of 10 each direction with theraband for next 4 weeks. Light duty next 2 weeks then can return to full duty (see letter). Icing as needed. Transition from norco to tylenol and/or advil as needed for pain. Follow up with me in 4 weeks or as needed.

## 2014-01-18 ENCOUNTER — Encounter: Payer: Self-pay | Admitting: Family Medicine

## 2014-01-18 NOTE — Assessment & Plan Note (Signed)
2/2 small lateral malleolus avulsion fracture.  Improved from last visit.  Add ankle strengthening exercises.  Start light duty and advance to full duty in 2 weeks.  Icing as needed.  Tylenol/advil as needed.  F/u in 4 weeks or prn.

## 2014-01-18 NOTE — Progress Notes (Signed)
Patient ID: Andrew Moreno, male   DOB: 05/27/1961, 53 y.o.   MRN: [id number]  PCP: Carlyle LipaGAITHER,MICHALE D, MD  Subjective:   HPI: Patient is a 53 y.o. male here for left ankle injury.  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 Lots of swelling, bruising. Could only put a little weight on this. Went to ED where x-rays showed a small avulsion fracture of lateral malleolus. Taking hydrocodone, icing. Using crutches also. No prior injuries to this ankle.  5/11: Patient reports ankle feels improved down to 4/10. No longer using boot. Has been out of work. Is icing and occasionally taking norco. Doing ROM exercises.  Past Medical History  Diagnosis Date  . Bulging lumbar disc   . Back pain   . MI (myocardial infarction)   . Hypertension     Current Outpatient Prescriptions on File Prior to Visit  Medication Sig Dispense Refill  . amLODipine (NORVASC) 5 MG tablet Take 5 mg by mouth daily.      Marland Kitchen. aspirin 81 MG tablet Take 81 mg by mouth daily.      Marland Kitchen. atorvastatin (LIPITOR) 40 MG tablet Take 40 mg by mouth daily at 6 PM.       . carvedilol (COREG) 25 MG tablet Take 25 mg by mouth 2 (two) times daily with a meal.      . HYDROcodone-acetaminophen (NORCO/VICODIN) 5-325 MG per tablet Take 1 tablet by mouth every 6 (six) hours as needed.  60 tablet  0  . lisinopril (PRINIVIL,ZESTRIL) 20 MG tablet Take 20 mg by mouth 2 (two) times daily.      . pantoprazole (PROTONIX) 20 MG tablet Take 20 mg by mouth daily.      . prasugrel (EFFIENT) 10 MG TABS tablet Take 10 mg by mouth daily.       No current facility-administered medications on file prior to visit.    Past Surgical History  Procedure Laterality Date  . Knee surgery    . Arm wound repair / closure    . Coronary angioplasty with stent placement    . Orchiectomy      Allergies  Allergen Reactions  . Penicillins Anaphylaxis    History   Social History  .  Marital Status: Married    Spouse Name: N/A    Number of Children: 6  . Years of Education: N/A   Occupational History  .      Electrician   Social History Main Topics  . Smoking status: Current Every Day Smoker -- 1.00 packs/day    Types: Cigarettes  . Smokeless tobacco: Never Used  . Alcohol Use: No  . Drug Use: Yes    Special: Marijuana     Comment: Marijuana  . Sexual Activity: Not on file   Other Topics Concern  . Not on file   Social History Narrative  . No narrative on file    Family History  Problem Relation Age of Onset  . CAD Father     Angina  . Stroke Father   . Hyperlipidemia Father   . Hypertension Father   . Sudden death Neg Hx   . Heart attack Neg Hx   . Diabetes Neg Hx     BP 108/70  Pulse 66  Ht 5\' 3"  (1.6 m)  Wt 110 lb (49.896 kg)  BMI 19.49 kg/m2  Review of Systems: See HPI above.    Objective:  Physical Exam:  Gen:  NAD  Left ankle: No swelling, bruising, other deformity. Mild limitation all directions TTP greatest lateral malleolus, none at base 5th metatarsal.  No other tenderness of ankle. 1+ ant drawer and talar tilt - no pain.   Mild pain syndesmotic compression. Thompsons test negative. NV intact distally.    Assessment & Plan:  1. Left ankle injury - 2/2 small lateral malleolus avulsion fracture.  Improved from last visit.  Add ankle strengthening exercises.  Start light duty and advance to full duty in 2 weeks.  Icing as needed.  Tylenol/advil as needed.  F/u in 4 weeks or prn.

## 2014-02-14 ENCOUNTER — Ambulatory Visit: Payer: Medicaid Other | Admitting: Family Medicine

## 2014-04-27 ENCOUNTER — Encounter (HOSPITAL_BASED_OUTPATIENT_CLINIC_OR_DEPARTMENT_OTHER): Payer: Self-pay | Admitting: Emergency Medicine

## 2014-04-27 ENCOUNTER — Emergency Department (HOSPITAL_BASED_OUTPATIENT_CLINIC_OR_DEPARTMENT_OTHER)
Admission: EM | Admit: 2014-04-27 | Discharge: 2014-04-27 | Disposition: A | Discharge status: Home / Self Care | Payer: Medicaid Other | Attending: Emergency Medicine | Admitting: Emergency Medicine

## 2014-04-27 ENCOUNTER — Emergency Department (HOSPITAL_BASED_OUTPATIENT_CLINIC_OR_DEPARTMENT_OTHER): Payer: Medicaid Other

## 2014-04-27 DIAGNOSIS — I252 Old myocardial infarction: Secondary | ICD-10-CM | POA: Diagnosis not present

## 2014-04-27 DIAGNOSIS — S60221A Contusion of right hand, initial encounter: Secondary | ICD-10-CM

## 2014-04-27 DIAGNOSIS — I10 Essential (primary) hypertension: Secondary | ICD-10-CM | POA: Diagnosis not present

## 2014-04-27 DIAGNOSIS — S6000XA Contusion of unspecified finger without damage to nail, initial encounter: Secondary | ICD-10-CM | POA: Insufficient documentation

## 2014-04-27 DIAGNOSIS — Z9861 Coronary angioplasty status: Secondary | ICD-10-CM | POA: Insufficient documentation

## 2014-04-27 DIAGNOSIS — Z7982 Long term (current) use of aspirin: Secondary | ICD-10-CM | POA: Diagnosis not present

## 2014-04-27 DIAGNOSIS — Z79899 Other long term (current) drug therapy: Secondary | ICD-10-CM | POA: Insufficient documentation

## 2014-04-27 DIAGNOSIS — Y9389 Activity, other specified: Secondary | ICD-10-CM | POA: Insufficient documentation

## 2014-04-27 DIAGNOSIS — Y929 Unspecified place or not applicable: Secondary | ICD-10-CM | POA: Insufficient documentation

## 2014-04-27 DIAGNOSIS — W11XXXA Fall on and from ladder, initial encounter: Secondary | ICD-10-CM | POA: Diagnosis not present

## 2014-04-27 DIAGNOSIS — Z880 Allergy status to penicillin: Secondary | ICD-10-CM | POA: Diagnosis not present

## 2014-04-27 DIAGNOSIS — F17200 Nicotine dependence, unspecified, uncomplicated: Secondary | ICD-10-CM | POA: Insufficient documentation

## 2014-04-27 DIAGNOSIS — S60229A Contusion of unspecified hand, initial encounter: Secondary | ICD-10-CM | POA: Insufficient documentation

## 2014-04-27 DIAGNOSIS — Z9889 Other specified postprocedural states: Secondary | ICD-10-CM | POA: Insufficient documentation

## 2014-04-27 DIAGNOSIS — S6990XA Unspecified injury of unspecified wrist, hand and finger(s), initial encounter: Secondary | ICD-10-CM | POA: Diagnosis present

## 2014-04-27 DIAGNOSIS — Z8739 Personal history of other diseases of the musculoskeletal system and connective tissue: Secondary | ICD-10-CM | POA: Diagnosis not present

## 2014-04-27 MED ORDER — HYDROCODONE-ACETAMINOPHEN 5-325 MG PO TABS
2.0000 | ORAL_TABLET | Freq: Once | ORAL | Status: AC
  Administered 2014-04-27: 2 via ORAL
  Filled 2014-04-27: qty 2

## 2014-04-27 MED ORDER — HYDROCODONE-ACETAMINOPHEN 5-325 MG PO TABS
1.0000 | ORAL_TABLET | Freq: Four times a day (QID) | ORAL | Status: AC | PRN
Start: 2014-04-27 — End: ?

## 2014-04-27 NOTE — ED Provider Notes (Signed)
CSN: [id number]     Arrival date & time 04/27/14  1656 History   First MD Initiated Contact with Patient 04/27/14 1720     Chief Complaint  Patient presents with  . Hand Pain     (Consider location/radiation/quality/duration/timing/severity/associated sxs/prior Treatment) HPI Comments: Pt caught himself after falling off ladder. Swelling to palm of right hand and pain in 3th & 4th digits. Hx of remote hand fx. On effient.   Patient is a 53 y.o. male presenting with hand pain. The history is provided by the patient. No language interpreter was used.  Hand Pain This is a new problem. The current episode started 1 to 2 hours ago. The problem occurs constantly. The problem has not changed since onset.Pertinent negatives include no chest pain, no abdominal pain, no headaches and no shortness of breath. Exacerbated by: palpation. The symptoms are relieved by rest. He has tried rest for the symptoms. The treatment provided mild relief.    Past Medical History  Diagnosis Date  . Bulging lumbar disc   . Back pain   . MI (myocardial infarction)   . Hypertension    Past Surgical History  Procedure Laterality Date  . Knee surgery    . Arm wound repair / closure    . Coronary angioplasty with stent placement    . Orchiectomy     Family History  Problem Relation Age of Onset  . CAD Father     Angina  . Stroke Father   . Hyperlipidemia Father   . Hypertension Father   . Sudden death Neg Hx   . Heart attack Neg Hx   . Diabetes Neg Hx    History  Substance Use Topics  . Smoking status: Current Every Day Smoker -- 1.00 packs/day    Types: Cigarettes  . Smokeless tobacco: Never Used  . Alcohol Use: No    Review of Systems  Constitutional: Negative for fever, activity change, appetite change and fatigue.  HENT: Negative for congestion, facial swelling, rhinorrhea and trouble swallowing.   Eyes: Negative for photophobia and pain.  Respiratory: Negative for cough, chest tightness and  shortness of breath.   Cardiovascular: Negative for chest pain and leg swelling.  Gastrointestinal: Negative for nausea, vomiting, abdominal pain, diarrhea and constipation.  Endocrine: Negative for polydipsia and polyuria.  Genitourinary: Negative for dysuria, urgency, decreased urine volume and difficulty urinating.  Musculoskeletal: Negative for back pain and gait problem.  Skin: Negative for color change, rash and wound.  Allergic/Immunologic: Negative for immunocompromised state.  Neurological: Negative for dizziness, facial asymmetry, speech difficulty, weakness, numbness and headaches.  Psychiatric/Behavioral: Negative for confusion, decreased concentration and agitation.      Allergies  Penicillins  Home Medications   Prior to Admission medications   Medication Sig Start Date End Date Taking? Authorizing Provider  amLODipine (NORVASC) 5 MG tablet Take 5 mg by mouth daily.    Historical Provider, MD  aspirin 81 MG tablet Take 81 mg by mouth daily.    Historical Provider, MD  atorvastatin (LIPITOR) 40 MG tablet Take 40 mg by mouth daily at 6 PM.     Historical Provider, MD  carvedilol (COREG) 25 MG tablet Take 25 mg by mouth 2 (two) times daily with a meal.    Historical Provider, MD  HYDROcodone-acetaminophen (NORCO/VICODIN) 5-325 MG per tablet Take 1 tablet by mouth every 6 (six) hours as needed. 01/03/14   Lenda Kelp, MD  HYDROcodone-acetaminophen (NORCO/VICODIN) 5-325 MG per tablet Take 1 tablet by mouth every  6 (six) hours as needed for moderate pain or severe pain. 04/27/14   Toy CookeyMegan Docherty, MD  lisinopril (PRINIVIL,ZESTRIL) 20 MG tablet Take 20 mg by mouth 2 (two) times daily.    Historical Provider, MD  pantoprazole (PROTONIX) 20 MG tablet Take 20 mg by mouth daily.    Historical Provider, MD  prasugrel (EFFIENT) 10 MG TABS tablet Take 10 mg by mouth daily.    Historical Provider, MD   BP 157/89  Pulse 62  Temp(Src) 98 F (36.7 C) (Oral)  Resp 16  Ht 5\' 3"  (1.6 m)   Wt 110 lb (49.896 kg)  BMI 19.49 kg/m2  SpO2 99% Physical Exam  Constitutional: He is oriented to person, place, and time. He appears well-developed and well-nourished. No distress.  HENT:  Head: Normocephalic and atraumatic.  Mouth/Throat: No oropharyngeal exudate.  Eyes: Pupils are equal, round, and reactive to light.  Neck: Normal range of motion. Neck supple.  Cardiovascular: Normal rate, regular rhythm and normal heart sounds.  Exam reveals no gallop and no friction rub.   No murmur heard. Pulmonary/Chest: Effort normal and breath sounds normal. No respiratory distress. He has no wheezes. He has no rales.  Abdominal: Soft. Bowel sounds are normal. He exhibits no distension and no mass. There is no tenderness. There is no rebound and no guarding.  Musculoskeletal: Normal range of motion. He exhibits no edema.       Right hand: He exhibits tenderness. He exhibits normal two-point discrimination. Normal sensation noted. Decreased strength (dec flexion due to pain) noted.       Hands: Neurological: He is alert and oriented to person, place, and time.  Skin: Skin is warm and dry.  Psychiatric: He has a normal mood and affect.    ED Course  Procedures (including critical care time) Labs Review Labs Reviewed - No data to display  Imaging Review Dg Hand Complete Right  04/27/2014   CLINICAL DATA:  Pain post trauma  EXAM: RIGHT HAND - COMPLETE 3+ VIEW  COMPARISON:  None.  FINDINGS: Frontal, oblique, and lateral views were obtained. There is evidence of an old healed fracture of the fifth metacarpal with mild bowing of the fifth metacarpal distally. There is no demonstrable acute fracture or dislocation. There is mild soft tissue swelling of the proximal second, third, and fourth digits. Joint spaces appear intact. No erosive change.  IMPRESSION: Old trauma with remodeling involving the fifth metacarpal. No acute fracture or dislocation.   Electronically Signed   By: Bretta BangWilliam  Woodruff M.D.    On: 04/27/2014 17:29     EKG Interpretation None      MDM   Final diagnoses:  Contusion of right hand including fingers, initial encounter    Pt is a 53 y.o. male with Pmhx as above who presents with pain of R palm, palmar 3th & 4th digits after landing on his hand this afternoon when he slipped off a ladder. No head injury, no LOC. Hand MVI distally. XR w/o acute findings. No pain over old 5th metacarpal fx. Rec RICE, will place in wrist splint for comfort. He can f/u with PCP in 1 week for continued symptoms.         Toy CookeyMegan Docherty, MD 04/27/14 [phone]

## 2014-04-27 NOTE — ED Notes (Signed)
Patient transported to X-ray 

## 2014-04-27 NOTE — ED Notes (Signed)
Pt caught himself after falling off ladder. Swelling to right hand

## 2014-04-27 NOTE — ED Notes (Signed)
MD at bedside. 

## 2014-04-27 NOTE — Discharge Instructions (Signed)
Contusion °A contusion is a deep bruise. Contusions happen when an injury causes bleeding under the skin. Signs of bruising include pain, puffiness (swelling), and discolored skin. The contusion may turn blue, purple, or yellow. °HOME CARE  °· Put ice on the injured area. °¨ Put ice in a plastic bag. °¨ Place a towel between your skin and the bag. °¨ Leave the ice on for 15-20 minutes, 03-04 times a day. °· Only take medicine as told by your doctor. °· Rest the injured area. °· If possible, raise (elevate) the injured area to lessen puffiness. °GET HELP RIGHT AWAY IF:  °· You have more bruising or puffiness. °· You have pain that is getting worse. °· Your puffiness or pain is not helped by medicine. °MAKE SURE YOU:  °· Understand these instructions. °· Will watch your condition. °· Will get help right away if you are not doing well or get worse. °Document Released: 02/12/2008 Document Revised: 11/18/2011 Document Reviewed: 07/01/2011 °[employer]® Patient Information ©2015 [employer]. This information is not intended to replace advice given to you by your health care provider. Make sure you discuss any questions you have with your health care provider. ° °

## 2015-06-22 IMAGING — CR DG HAND COMPLETE 3+V*R*
3 series · 3 of 3 positions shown · non-contrast
Comparison: None.

CLINICAL DATA: Pain post trauma

EXAM:
RIGHT HAND - COMPLETE 3+ VIEW

[x hand pa right]
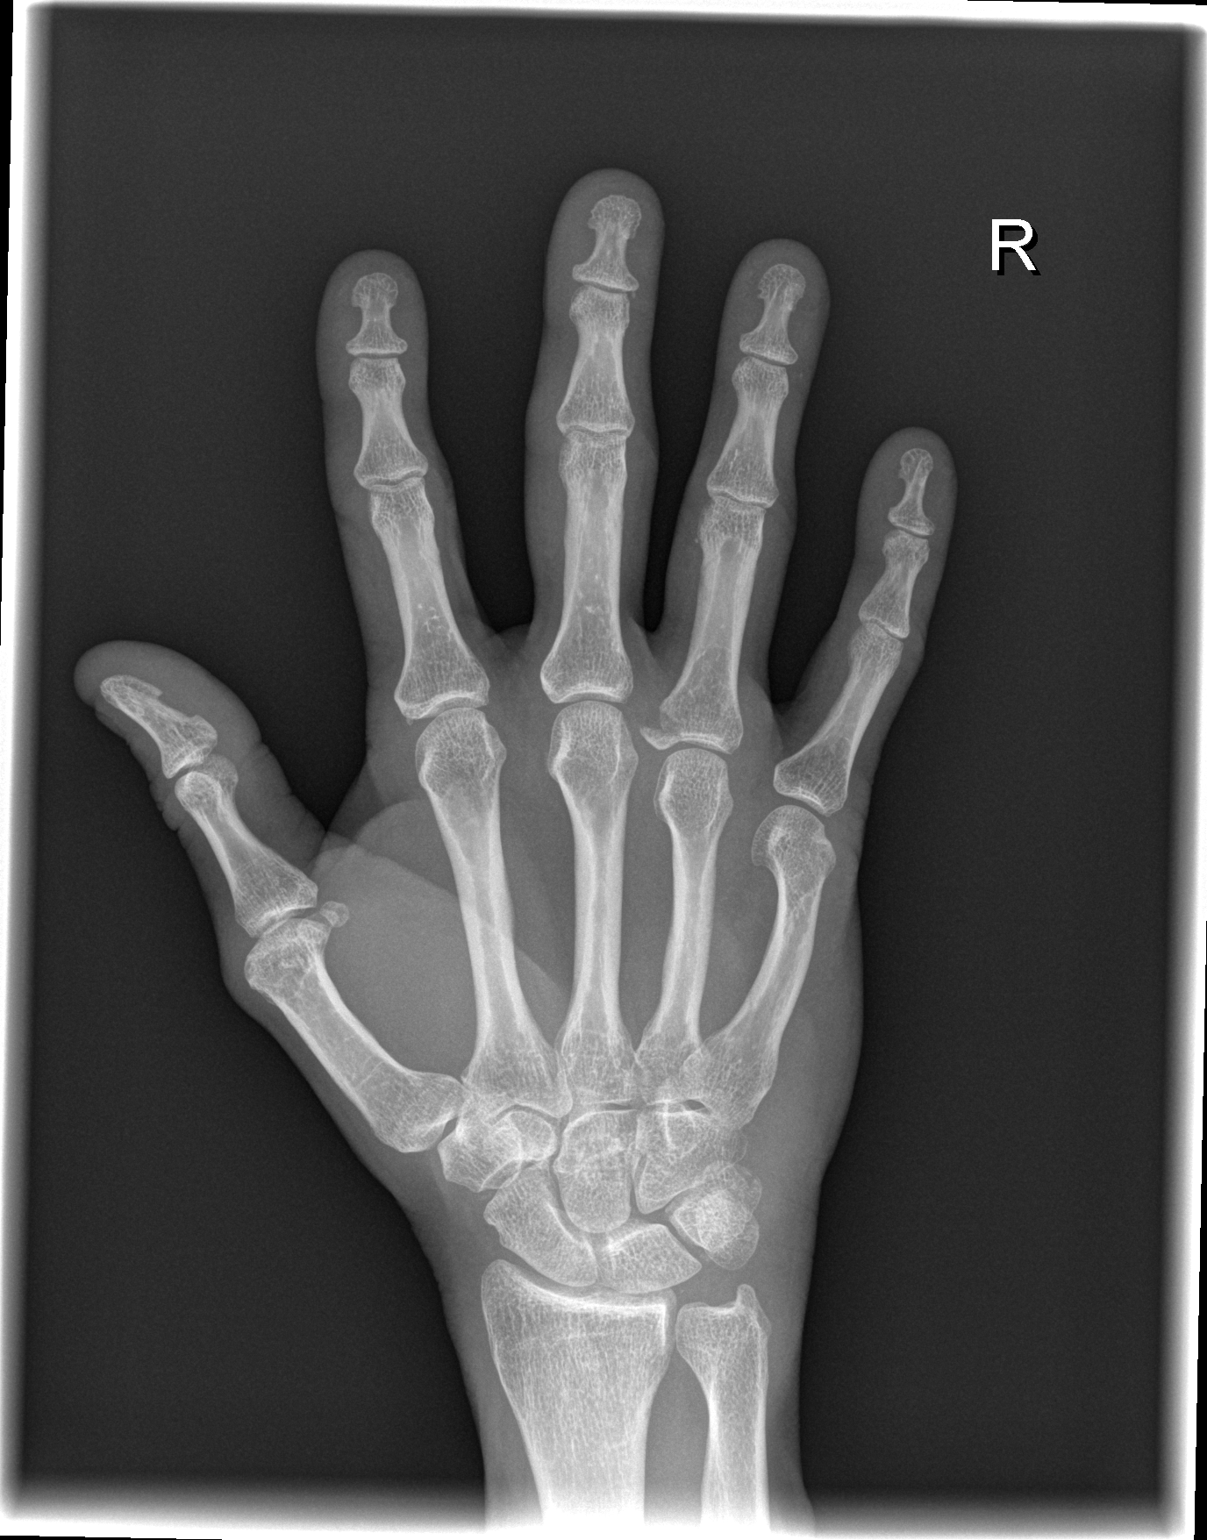

[x hand oblique right]
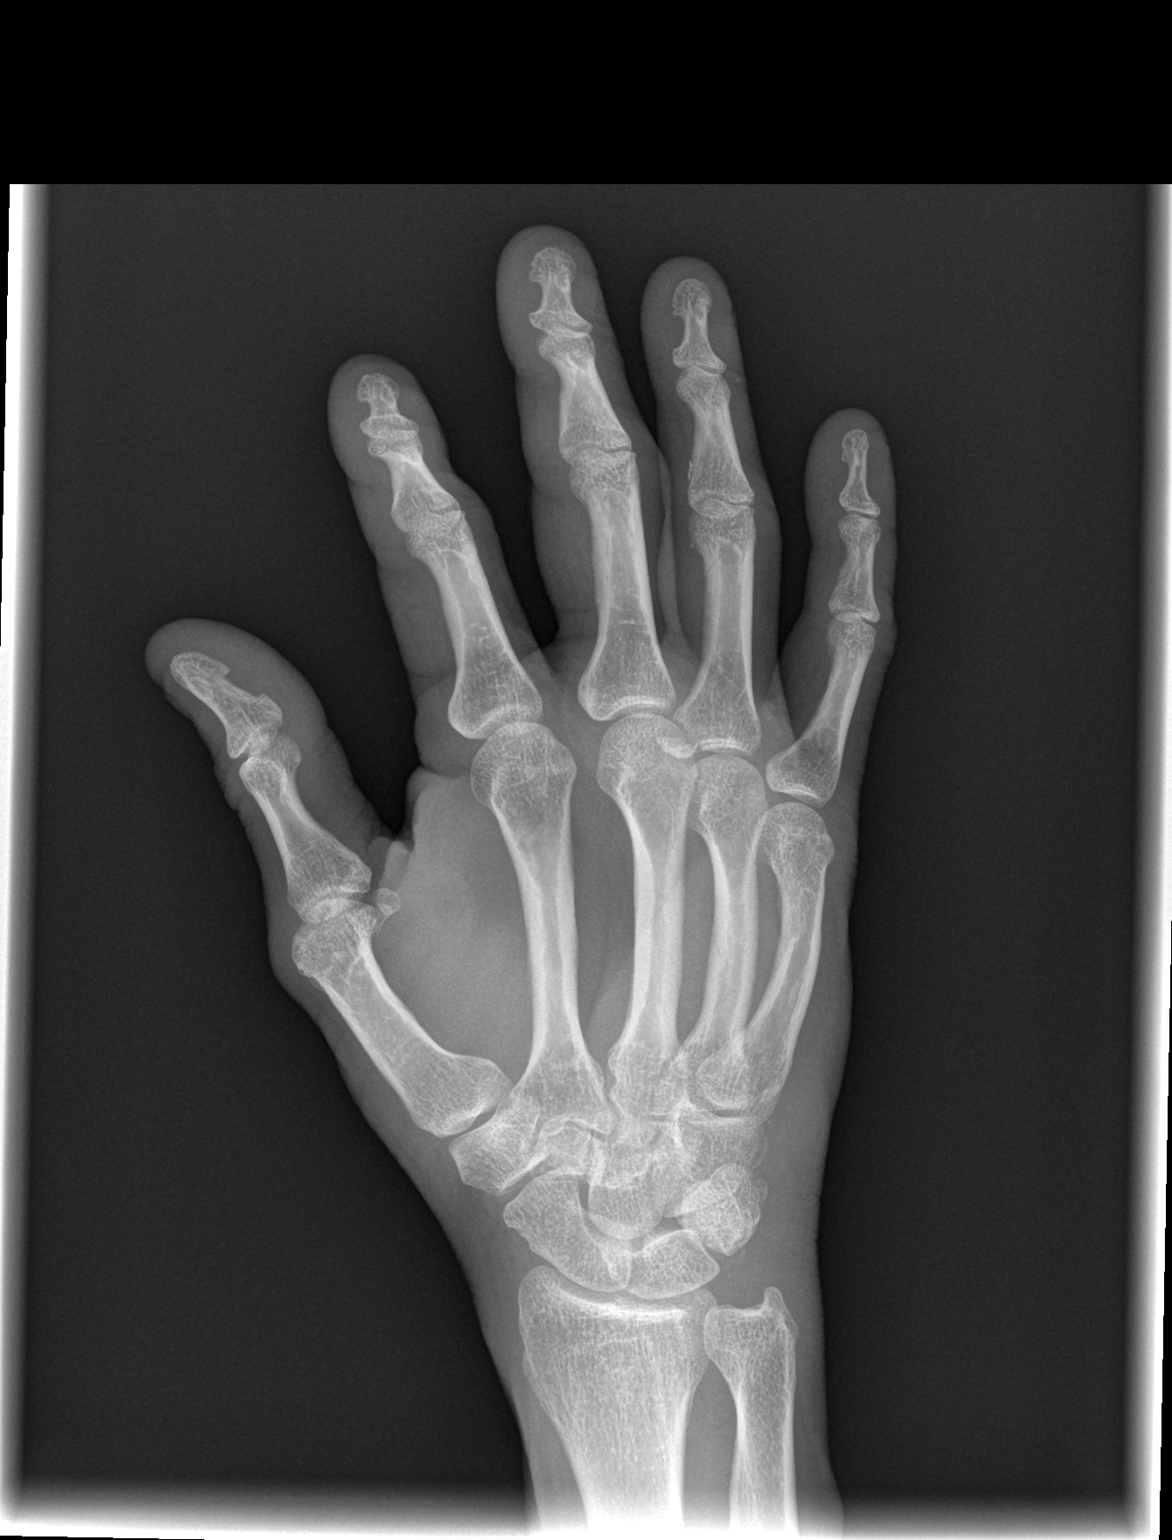

[x hand lat right]
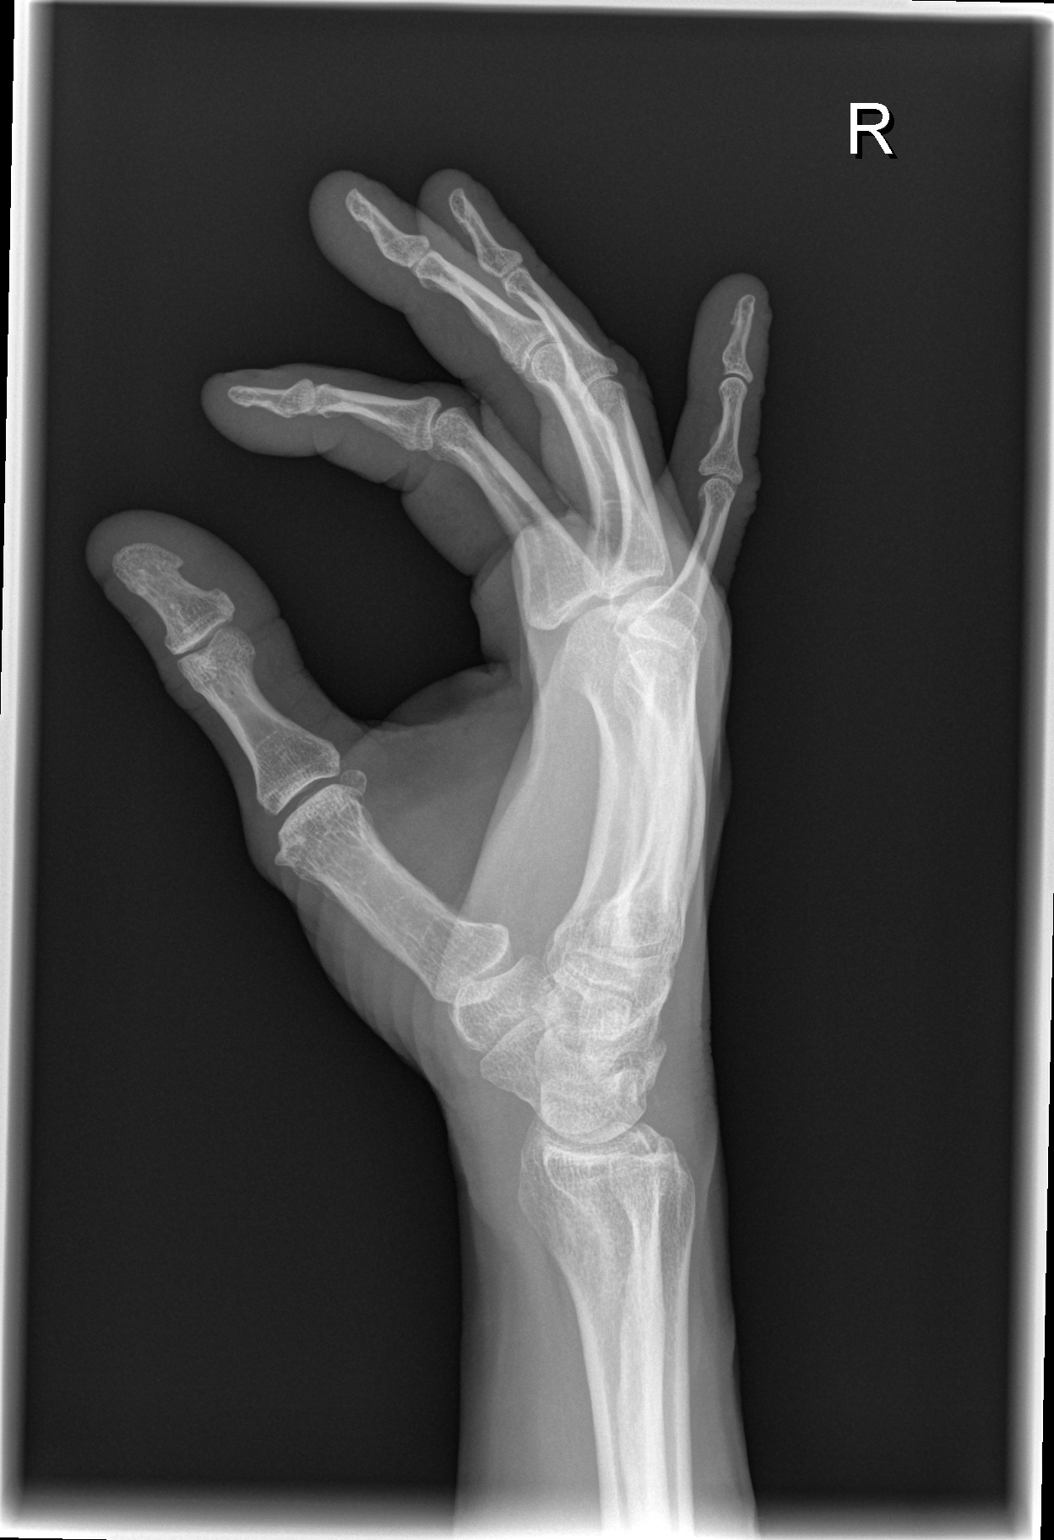

[3 of 3 positions shown; findings below may reference images not displayed]

FINDINGS: Frontal, oblique, and lateral views were obtained. There is evidence
of an old healed fracture of the fifth metacarpal with mild bowing
of the fifth metacarpal distally. There is no demonstrable acute
fracture or dislocation. There is mild soft tissue swelling of the
proximal second, third, and fourth digits. Joint spaces appear
intact. No erosive change.
IMPRESSION: Old trauma with remodeling involving the fifth metacarpal. No acute
fracture or dislocation.

## 2017-01-12 ENCOUNTER — Emergency Department (HOSPITAL_BASED_OUTPATIENT_CLINIC_OR_DEPARTMENT_OTHER)
Admission: EM | Admit: 2017-01-12 | Discharge: 2017-01-12 | Disposition: A | Discharge status: Home / Self Care | Payer: Medicaid Other | Attending: Dermatology | Admitting: Dermatology

## 2017-01-12 ENCOUNTER — Encounter (HOSPITAL_BASED_OUTPATIENT_CLINIC_OR_DEPARTMENT_OTHER): Payer: Self-pay | Admitting: Emergency Medicine

## 2017-01-12 DIAGNOSIS — S61011A Laceration without foreign body of right thumb without damage to nail, initial encounter: Secondary | ICD-10-CM | POA: Insufficient documentation

## 2017-01-12 DIAGNOSIS — Y929 Unspecified place or not applicable: Secondary | ICD-10-CM | POA: Insufficient documentation

## 2017-01-12 DIAGNOSIS — Y939 Activity, unspecified: Secondary | ICD-10-CM | POA: Insufficient documentation

## 2017-01-12 DIAGNOSIS — W260XXA Contact with knife, initial encounter: Secondary | ICD-10-CM | POA: Diagnosis not present

## 2017-01-12 DIAGNOSIS — Y999 Unspecified external cause status: Secondary | ICD-10-CM | POA: Diagnosis not present

## 2017-01-12 DIAGNOSIS — I10 Essential (primary) hypertension: Secondary | ICD-10-CM | POA: Insufficient documentation

## 2017-01-12 DIAGNOSIS — F17210 Nicotine dependence, cigarettes, uncomplicated: Secondary | ICD-10-CM | POA: Insufficient documentation

## 2017-01-12 DIAGNOSIS — Z5321 Procedure and treatment not carried out due to patient leaving prior to being seen by health care provider: Secondary | ICD-10-CM | POA: Insufficient documentation

## 2017-01-12 NOTE — ED Triage Notes (Signed)
Pt c/o lac to RT thumb; sts stabbed it with pocket knife trying to cut a zip tie
